# Patient Record
Sex: Female | Born: 1965 | Race: White | Hispanic: No | Marital: Married | Smoking: Former smoker
Health system: Southern US, Community
[De-identification: ages and names within clinical notes are randomized; demographics above are authoritative.]

## PROBLEM LIST (undated history)

## (undated) DIAGNOSIS — F329 Major depressive disorder, single episode, unspecified: Secondary | ICD-10-CM

## (undated) DIAGNOSIS — F32A Depression, unspecified: Secondary | ICD-10-CM

## (undated) DIAGNOSIS — T753XXA Motion sickness, initial encounter: Secondary | ICD-10-CM

## (undated) DIAGNOSIS — D649 Anemia, unspecified: Secondary | ICD-10-CM

## (undated) HISTORY — DX: Anemia, unspecified: D64.9

## (undated) HISTORY — PX: MOUTH SURGERY: SHX715

## (undated) HISTORY — DX: Depression, unspecified: F32.A

---
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
## 2011-06-02 ENCOUNTER — Ambulatory Visit: Payer: Self-pay

## 2012-08-01 IMAGING — CR DG CHEST 2V
1 series · 2 of 2 positions shown · non-contrast
Comparison: none

REASON FOR EXAM: unable to take deep breaths
COMMENTS:

PROCEDURE:     MDR - MDR CHEST PA(OR AP) AND LATERAL  - June 02, 2011  [DATE]
RESULT:     Lungs are clear, cardiac structures are unremarkable.

[Series 1: view not recorded · 0.17mm/px · 2 of 2 slices shown]
[im 1/2]
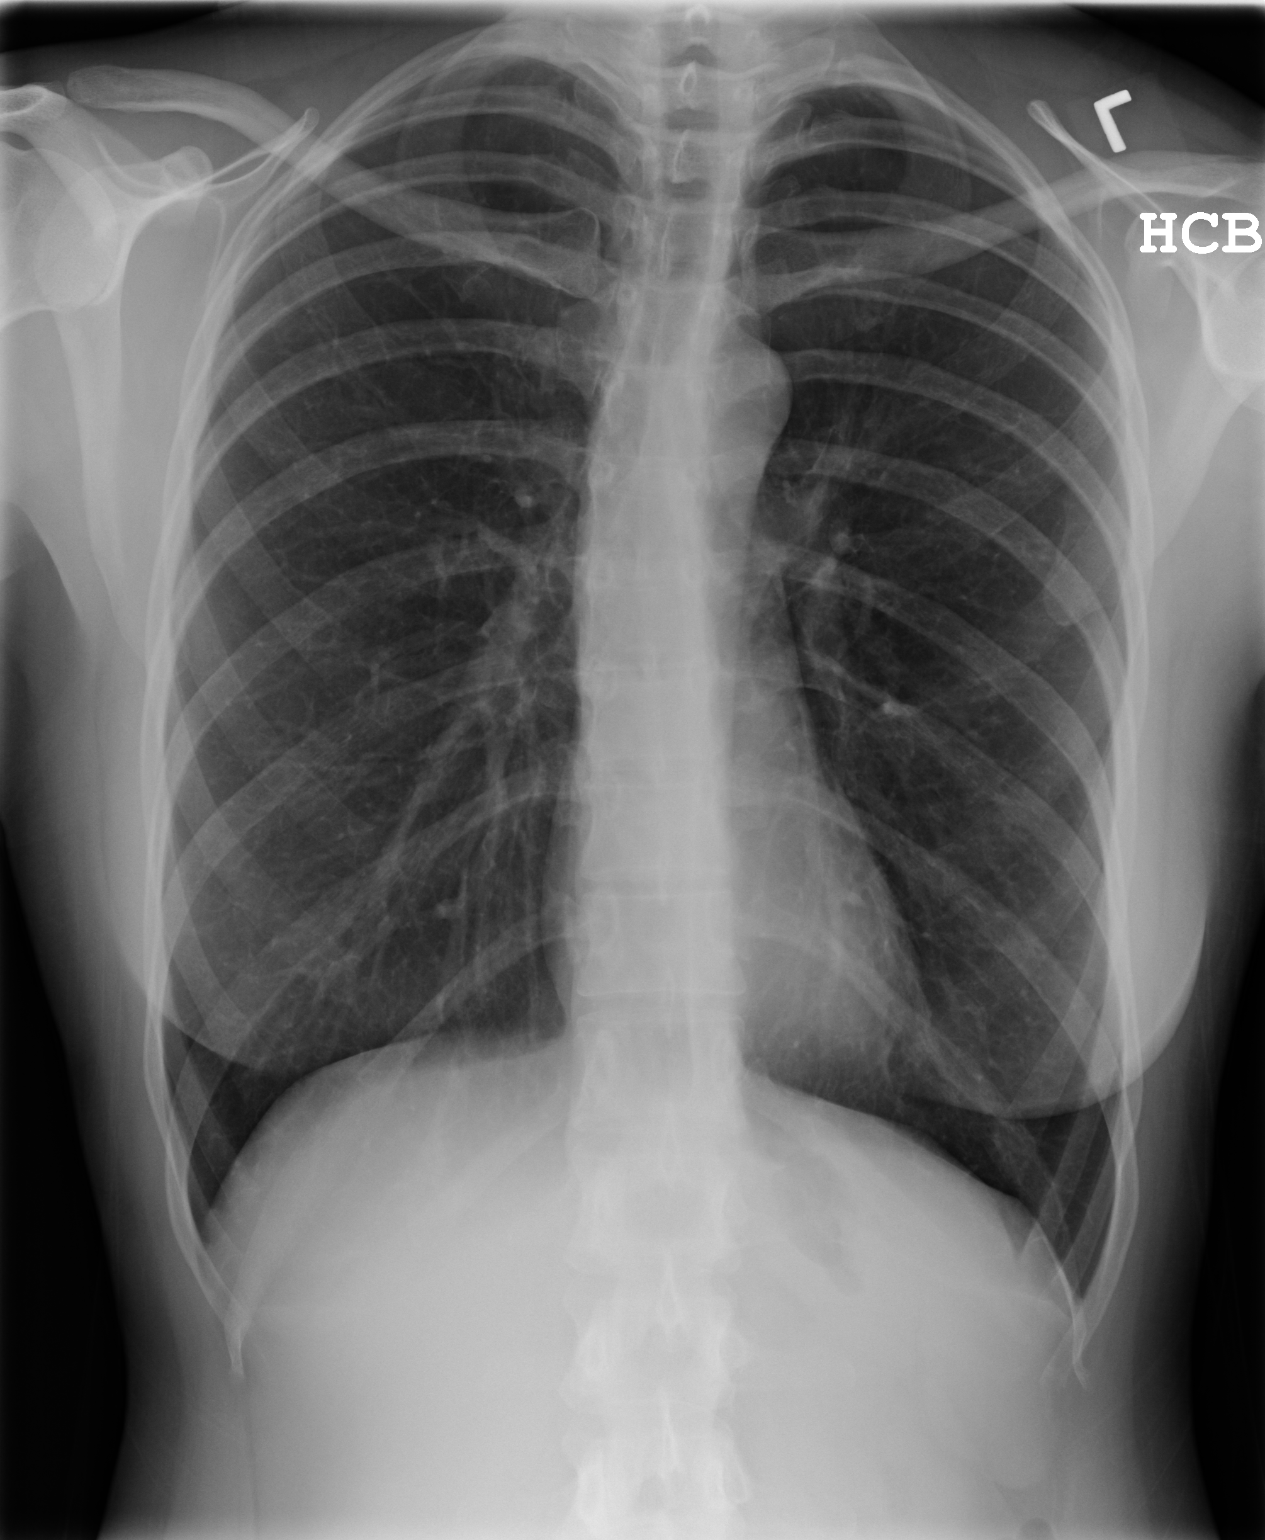
[im 2/2]
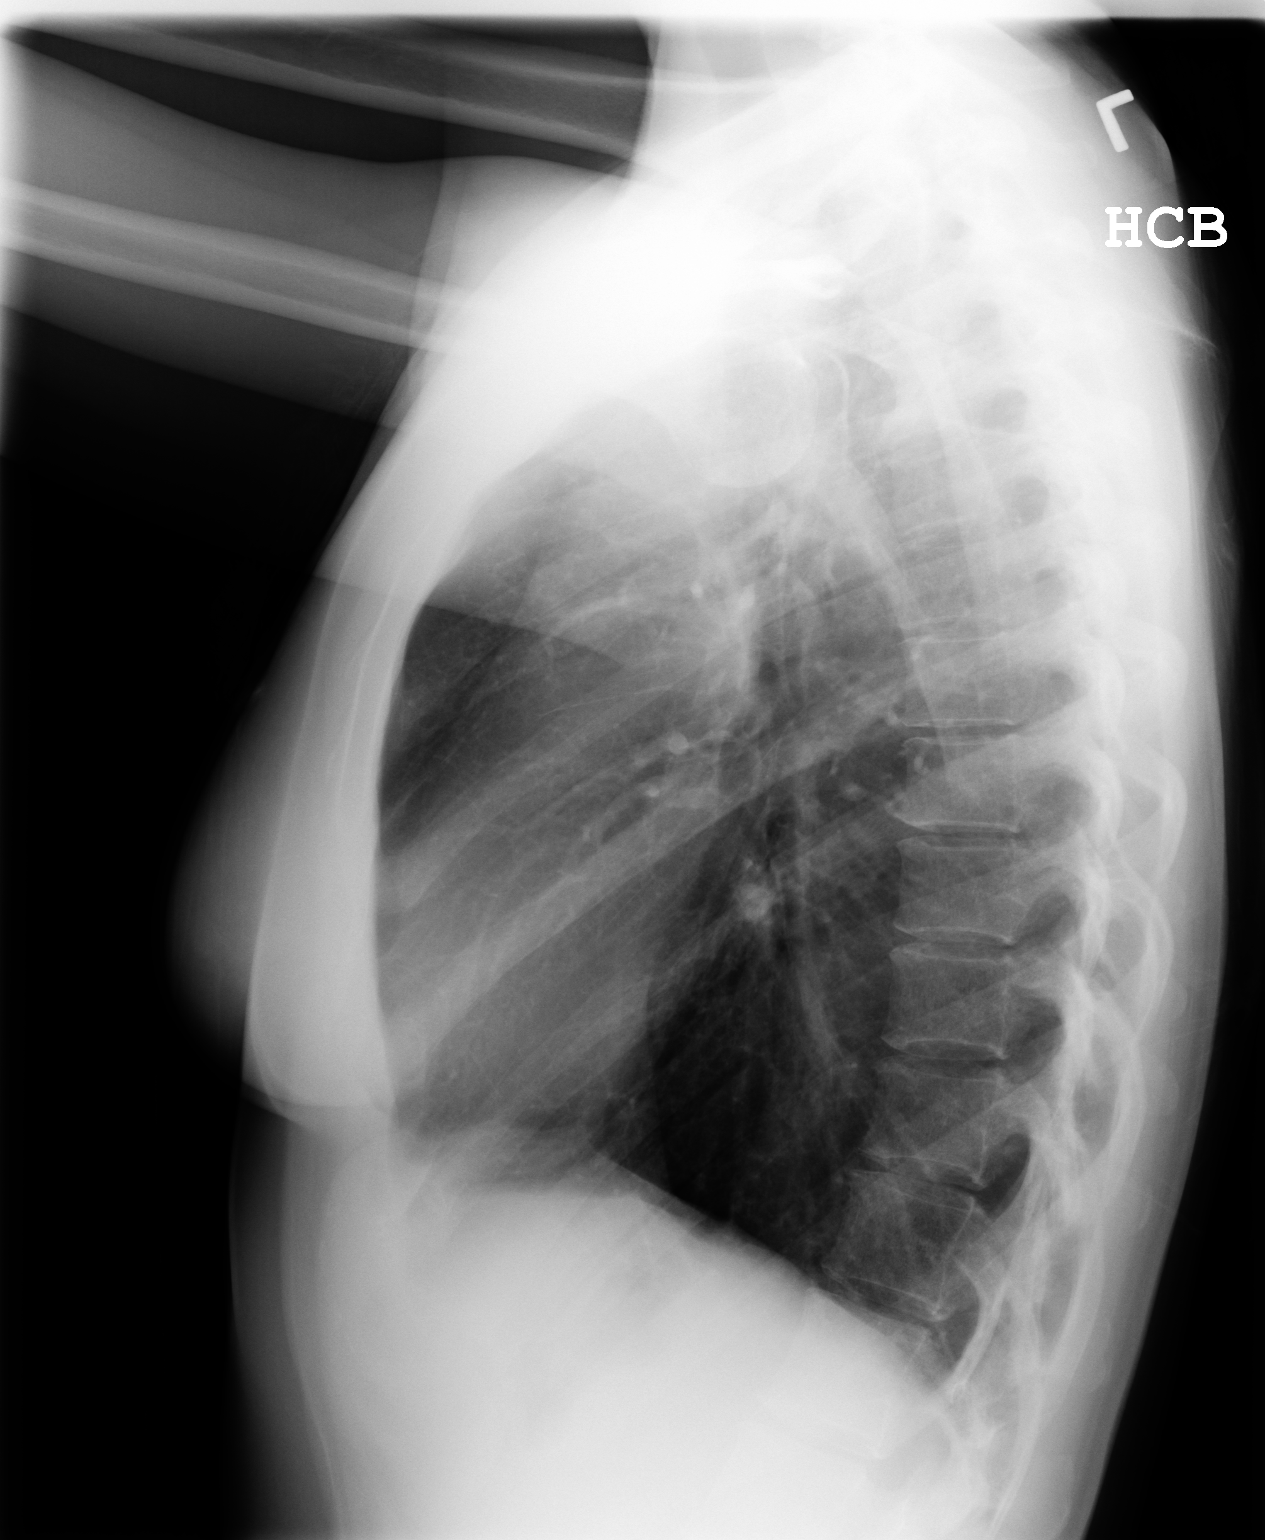

[2 of 2 positions shown; findings below may reference images not displayed]

IMPRESSION: No acute abnormality.

## 2020-03-12 ENCOUNTER — Other Ambulatory Visit (HOSPITAL_COMMUNITY)
Admission: RE | Admit: 2020-03-12 | Discharge: 2020-03-12 | Disposition: A | Discharge status: Home / Self Care | Payer: No Typology Code available for payment source | Source: Ambulatory Visit | Attending: Rehabilitation | Admitting: Rehabilitation

## 2020-03-12 ENCOUNTER — Encounter: Payer: Self-pay | Admitting: Family Medicine

## 2020-03-12 ENCOUNTER — Other Ambulatory Visit: Payer: Self-pay

## 2020-03-12 ENCOUNTER — Other Ambulatory Visit (HOSPITAL_COMMUNITY)
Admission: RE | Admit: 2020-03-12 | Discharge: 2020-03-12 | Disposition: A | Discharge status: Home / Self Care | Payer: No Typology Code available for payment source | Source: Ambulatory Visit | Attending: Family Medicine | Admitting: Family Medicine

## 2020-03-12 ENCOUNTER — Ambulatory Visit (INDEPENDENT_AMBULATORY_CARE_PROVIDER_SITE_OTHER): Payer: Self-pay | Admitting: Family Medicine

## 2020-03-12 VITALS — BP 137/89 | HR 69 | Temp 98.5°F | Ht 67.5 in | Wt 187.0 lb

## 2020-03-12 DIAGNOSIS — Z23 Encounter for immunization: Secondary | ICD-10-CM | POA: Diagnosis not present

## 2020-03-12 DIAGNOSIS — D509 Iron deficiency anemia, unspecified: Secondary | ICD-10-CM

## 2020-03-12 DIAGNOSIS — Z136 Encounter for screening for cardiovascular disorders: Secondary | ICD-10-CM | POA: Diagnosis not present

## 2020-03-12 DIAGNOSIS — Z1231 Encounter for screening mammogram for malignant neoplasm of breast: Secondary | ICD-10-CM

## 2020-03-12 DIAGNOSIS — Z124 Encounter for screening for malignant neoplasm of cervix: Secondary | ICD-10-CM

## 2020-03-12 DIAGNOSIS — Z0000 Encounter for general adult medical examination without abnormal findings: Secondary | ICD-10-CM

## 2020-03-12 DIAGNOSIS — L918 Other hypertrophic disorders of the skin: Secondary | ICD-10-CM | POA: Diagnosis not present

## 2020-03-12 DIAGNOSIS — Z7689 Persons encountering health services in other specified circumstances: Secondary | ICD-10-CM

## 2020-03-12 DIAGNOSIS — Z800 Family history of malignant neoplasm of digestive organs: Secondary | ICD-10-CM

## 2020-03-12 DIAGNOSIS — D489 Neoplasm of uncertain behavior, unspecified: Secondary | ICD-10-CM | POA: Diagnosis not present

## 2020-03-12 DIAGNOSIS — L821 Other seborrheic keratosis: Secondary | ICD-10-CM

## 2020-03-12 NOTE — Patient Instructions (Signed)
[facility] - [phone]  Call to schedule mammogram appointment

## 2020-03-12 NOTE — Assessment & Plan Note (Signed)
Recheck labs, continue iron supplementation.

## 2020-03-12 NOTE — Progress Notes (Signed)
BP 137/89   Pulse 69   Temp 98.5 F (36.9 C) (Oral)   Ht 5' 7.5" (1.715 m)   Wt 187 lb (84.8 kg)   SpO2 99%   BMI 28.86 kg/m    Subjective:    Patient ID: Annette Griffin, female    DOB: Nov 03, 1965, 54 y.o.   MRN: [id number]  HPI: Annette Griffin is a 54 y.o. female presenting on 03/12/2020 for comprehensive medical examination. Current medical complaints include:see below   Here today to establish care. Notes she has significant anxiety associated with doctor's offices so has not sought care in years. Also has not had insurance for quite some time.   Taking an iron supplement for iron def anemia, feeling well overall on this regimen. Was diagnosed in 2014 due to fatigue, numbness in extremities.   Father had colon cancer at age 46. Has never had colonoscopy but ready to get one.   Last CPE was many years ago. Last pap was 2014, benign. Never had mammogram.   Has a skin tag on left upper back she would like looked at. Has had several places removed in the past, no hx of skin cancer.      She currently lives with: Menopausal Symptoms: no  Depression Screen done today and results listed below:  Depression screen [facility] 2/9 03/12/2020  Decreased Interest 1  Down, Depressed, Hopeless 0  PHQ - 2 Score 1  Altered sleeping 3  Tired, decreased energy 3  Change in appetite 3  Feeling bad or failure about yourself  1  Trouble concentrating 0  Moving slowly or fidgety/restless 0  Suicidal thoughts 0  PHQ-9 Score 11    The patient does not have a history of falls. I did complete a risk assessment for falls. A plan of care for falls was documented.   Past Medical History:  Past Medical History:  Diagnosis Date  . Anemia   . Depression     Surgical History:  History reviewed. No pertinent surgical history.  Medications:  Current Outpatient Medications on File Prior to Visit  Medication Sig  . ferrous sulfate 325 (65 FE) MG EC tablet Take 325 mg by mouth daily.   No  current facility-administered medications on file prior to visit.    Allergies:  No Known Allergies  Social History:  Social History   Socioeconomic History  . Marital status: Married    Spouse name: Not on file  . Number of children: Not on file  . Years of education: Not on file  . Highest education level: Not on file  Occupational History  . Not on file  Tobacco Use  . Smoking status: Former Research scientist (life sciences)  . Smokeless tobacco: Never Used  Substance and Sexual Activity  . Alcohol use: Yes    Alcohol/week: 10.0 standard drinks    Types: 10 Glasses of wine per week  . Drug use: Never  . Sexual activity: Yes  Other Topics Concern  . Not on file  Social History Narrative  . Not on file   Social Determinants of Health   Financial Resource Strain:   . Difficulty of Paying Living Expenses:   Food Insecurity:   . Worried About Charity fundraiser in the Last Year:   . Arboriculturist in the Last Year:   Transportation Needs:   . Film/video editor (Medical):   Marland Kitchen Lack of Transportation (Non-Medical):   Physical Activity:   . Days of Exercise per Week:   .  Minutes of Exercise per Session:   Stress:   . Feeling of Stress :   Social Connections:   . Frequency of Communication with Friends and Family:   . Frequency of Social Gatherings with Friends and Family:   . Attends Religious Services:   . Active Member of Clubs or Organizations:   . Attends Archivist Meetings:   Marland Kitchen Marital Status:   Intimate Partner Violence:   . Fear of Current or Ex-Partner:   . Emotionally Abused:   Marland Kitchen Physically Abused:   . Sexually Abused:    Social History   Tobacco Use  Smoking Status Former Smoker  Smokeless Tobacco Never Used   Social History   Substance and Sexual Activity  Alcohol Use Yes  . Alcohol/week: 10.0 standard drinks  . Types: 10 Glasses of wine per week    Family History:  Family History  Problem Relation Age of Onset  . Colon cancer Father     Past  medical history, surgical history, medications, allergies, family history and social history reviewed with patient today and changes made to appropriate areas of the chart.   Review of Systems - General ROS: negative Psychological ROS: negative Ophthalmic ROS: negative ENT ROS: negative Allergy and Immunology ROS: negative Hematological and Lymphatic ROS: negative Endocrine ROS: negative Breast ROS: negative for breast lumps Respiratory ROS: no cough, shortness of breath, or wheezing Cardiovascular ROS: no chest pain or dyspnea on exertion Gastrointestinal ROS: no abdominal pain, change in bowel habits, or black or bloody stools Genito-Urinary ROS: no dysuria, trouble voiding, or hematuria Musculoskeletal ROS: negative Neurological ROS: no TIA or stroke symptoms Dermatological ROS: negative All other ROS negative except what is listed above and in the HPI.      Objective:    BP 137/89   Pulse 69   Temp 98.5 F (36.9 C) (Oral)   Ht 5' 7.5" (1.715 m)   Wt 187 lb (84.8 kg)   SpO2 99%   BMI 28.86 kg/m   Wt Readings from Last 3 Encounters:  03/12/20 187 lb (84.8 kg)    Physical Exam Vitals and nursing note reviewed.  Constitutional:      General: She is not in acute distress.    Appearance: She is well-developed.  HENT:     Head: Atraumatic.     Right Ear: External ear normal.     Left Ear: External ear normal.     Nose: Nose normal.     Mouth/Throat:     Pharynx: No oropharyngeal exudate.  Eyes:     General: No scleral icterus.    Conjunctiva/sclera: Conjunctivae normal.     Pupils: Pupils are equal, round, and reactive to light.  Neck:     Thyroid: No thyromegaly.  Cardiovascular:     Rate and Rhythm: Normal rate and regular rhythm.     Heart sounds: Normal heart sounds.  Pulmonary:     Effort: Pulmonary effort is normal. No respiratory distress.     Breath sounds: Normal breath sounds.  Chest:     Breasts:        Right: No mass, skin change or tenderness.         Left: No mass, skin change or tenderness.  Abdominal:     General: Bowel sounds are normal.     Palpations: Abdomen is soft. There is no mass.     Tenderness: There is no abdominal tenderness.  Genitourinary:    General: Normal vulva.     Vagina: No  vaginal discharge.  Musculoskeletal:        General: No tenderness. Normal range of motion.     Cervical back: Normal range of motion and neck supple.  Lymphadenopathy:     Cervical: No cervical adenopathy.     Upper Body:     Right upper body: No axillary adenopathy.     Left upper body: No axillary adenopathy.  Skin:    General: Skin is warm and dry.     Findings: No rash.     Comments: 3 pinpoint skin tags present left axilla Seborrheic keratosis present left low back  Neurological:     Mental Status: She is alert and oriented to person, place, and time.     Cranial Nerves: No cranial nerve deficit.  Psychiatric:        Behavior: Behavior normal.    Procedure: Cryotherapy inflamed skin tags left axilla Procedure explained in detail and pt questions answered. Cryotherapy wand used to treat bases of 3 skin tags left axilla. Procedure tolerated well without immediate complications noted. Home care and expectations given, may need re-treatment in a few weeks.   No results found for this or any previous visit.    Assessment & Plan:   Problem List Items Addressed This Visit      Other   Iron deficiency anemia    Recheck labs, continue iron supplementation.       Relevant Medications   ferrous sulfate 325 (65 FE) MG EC tablet   Other Relevant Orders   CBC with Differential/Platelet   Ferritin    Other Visit Diagnoses    Inflamed skin tag    -  Primary   Cryotherapy used today, home care and return precautions reviewed.    Encounter to establish care       Neoplasm of uncertain behavior       Requesting referral to Derm for skin checks, hx of atypical moles s/p excision   Relevant Orders   Ambulatory referral to  Dermatology   Seborrheic keratosis       Reassurance given   Annual physical exam       Relevant Orders   CBC with Differential/Platelet   Comprehensive metabolic panel   TSH   UA/M w/rflx Culture, Routine   Screening for cardiovascular condition       Relevant Orders   Lipid Panel w/o Chol/HDL Ratio   Need for diphtheria-tetanus-pertussis (Tdap) vaccine       Relevant Orders   Tdap vaccine greater than or equal to 7yo IM (Completed)   Screening for cervical cancer       Relevant Orders   Cytology - PAP   Encounter for screening mammogram for malignant neoplasm of breast       Relevant Orders   MM DIGITAL SCREENING BILATERAL   Family history of colon cancer       Relevant Orders   Ambulatory referral to Gastroenterology       Follow up plan: Return in about 1 year (around 03/12/2021) for CPE.   LABORATORY TESTING:  - Pap smear: pap done  IMMUNIZATIONS:   - Tdap: Tetanus vaccination status reviewed: Td vaccination indicated and given today. - Influenza: Postponed to flu season  SCREENING: -Mammogram: Ordered today  - Colonoscopy: Ordered today   PATIENT COUNSELING:   Advised to take 1 mg of folate supplement per day if capable of pregnancy.   Sexuality: Discussed sexually transmitted diseases, partner selection, use of condoms, avoidance of unintended pregnancy  and contraceptive alternatives.  Advised to avoid cigarette smoking.  I discussed with the patient that most people either abstain from alcohol or drink within safe limits (<=14/week and <=4 drinks/occasion for males, <=7/weeks and <= 3 drinks/occasion for females) and that the risk for alcohol disorders and other health effects rises proportionally with the number of drinks per week and how often a drinker exceeds daily limits.  Discussed cessation/primary prevention of drug use and availability of treatment for abuse.   Diet: Encouraged to adjust caloric intake to maintain  or achieve ideal body weight,  to reduce intake of dietary saturated fat and total fat, to limit sodium intake by avoiding high sodium foods and not adding table salt, and to maintain adequate dietary potassium and calcium preferably from fresh fruits, vegetables, and low-fat dairy products.    stressed the importance of regular exercise  Injury prevention: Discussed safety belts, safety helmets, smoke detector, smoking near bedding or upholstery.   Dental health: Discussed importance of regular tooth brushing, flossing, and dental visits.    NEXT PREVENTATIVE PHYSICAL DUE IN 1 YEAR. Return in about 1 year (around 03/12/2021) for CPE.

## 2020-03-12 NOTE — Progress Notes (Deleted)
   BP 137/89   Pulse 69   Temp 98.5 F (36.9 C) (Oral)   Ht 5' 7.5" (1.715 m)   Wt 187 lb (84.8 kg)   SpO2 99%   BMI 28.86 kg/m    Subjective:    Patient ID: Annette Griffin, female    DOB: [birth year], 54 y.o.   MRN: [id number]  HPI: Annette Griffin is a 54 y.o. female  Chief Complaint  Patient presents with  . Establish Care    pt has concerns about has had molds on her body. requested an order for a colonoscopy   Here today to establish care. Notes she has significant anxiety associated with doctor's offices so has not sought care in years. Also has not had insurance for quite some time.   Taking an iron supplement for iron def anemia, feeling well overall on this regimen. Was diagnosed in 2014 due to fatigue, numbness in extremities.   Father had colon cancer at age 17. Has never had colonoscopy but ready to get one.   Last CPE was many years ago. Last pap was 2014, benign. Never had mammogram.   Has a skin tag on left upper back she would like looked at. Has had several places removed in the past, no hx of skin cancer.   Relevant past medical, surgical, family and social history reviewed and updated as indicated. Interim medical history since our last visit reviewed. Allergies and medications reviewed and updated.  Review of Systems  Per HPI unless specifically indicated above     Objective:    BP 137/89   Pulse 69   Temp 98.5 F (36.9 C) (Oral)   Ht 5' 7.5" (1.715 m)   Wt 187 lb (84.8 kg)   SpO2 99%   BMI 28.86 kg/m   Wt Readings from Last 3 Encounters:  03/12/20 187 lb (84.8 kg)    Physical Exam  No results found for this or any previous visit.    Assessment & Plan:   Problem List Items Addressed This Visit    None       Follow up plan: No follow-ups on file.

## 2020-03-13 LAB — COMPREHENSIVE METABOLIC PANEL
ALT: 16 IU/L (ref 0–32)
AST: 18 IU/L (ref 0–40)
Albumin/Globulin Ratio: 1.9 (ref 1.2–2.2)
Albumin: 4.6 g/dL (ref 3.8–4.9)
Alkaline Phosphatase: 51 IU/L (ref 48–121)
BUN/Creatinine Ratio: 15 (ref 9–23)
BUN: 9 mg/dL (ref 6–24)
Bilirubin Total: 0.4 mg/dL (ref 0.0–1.2)
CO2: 22 mmol/L (ref 20–29)
Calcium: 9.7 mg/dL (ref 8.7–10.2)
Chloride: 101 mmol/L (ref 96–106)
Creatinine, Ser: 0.62 mg/dL (ref 0.57–1.00)
GFR calc Af Amer: 120 mL/min/{1.73_m2} (ref >59)
GFR calc non Af Amer: 104 mL/min/{1.73_m2} (ref >59)
Globulin, Total: 2.4 g/dL (ref 1.5–4.5)
Glucose: 87 mg/dL (ref 65–99)
Potassium: 4 mmol/L (ref 3.5–5.2)
Sodium: 141 mmol/L (ref 134–144)
Total Protein: 7 g/dL (ref 6.0–8.5)

## 2020-03-13 LAB — CBC WITH DIFFERENTIAL/PLATELET
Basophils Absolute: 0 10*3/uL (ref 0.0–0.2)
Basos: 1 %
EOS (ABSOLUTE): 0.1 10*3/uL (ref 0.0–0.4)
Eos: 2 %
Hematocrit: 41.8 % (ref 34.0–46.6)
Hemoglobin: 13.8 g/dL (ref 11.1–15.9)
Immature Grans (Abs): 0 10*3/uL (ref 0.0–0.1)
Immature Granulocytes: 0 %
Lymphocytes Absolute: 1.4 10*3/uL (ref 0.7–3.1)
Lymphs: 27 %
MCH: 32.5 pg (ref 26.6–33.0)
MCHC: 33 g/dL (ref 31.5–35.7)
MCV: 98 fL — ABNORMAL HIGH (ref 79–97)
Monocytes Absolute: 0.4 10*3/uL (ref 0.1–0.9)
Monocytes: 8 %
Neutrophils Absolute: 3.2 10*3/uL (ref 1.4–7.0)
Neutrophils: 62 %
Platelets: 256 10*3/uL (ref 150–450)
RBC: 4.25 x10E6/uL (ref 3.77–5.28)
RDW: 12.8 % (ref 11.7–15.4)
WBC: 5.1 10*3/uL (ref 3.4–10.8)

## 2020-03-13 LAB — TSH: TSH: 2.04 u[IU]/mL (ref 0.450–4.500)

## 2020-03-13 LAB — LIPID PANEL W/O CHOL/HDL RATIO
Cholesterol, Total: 194 mg/dL (ref 100–199)
HDL: 69 mg/dL (ref >39)
LDL Chol Calc (NIH): 102 mg/dL — ABNORMAL HIGH (ref 0–99)
Triglycerides: 130 mg/dL (ref 0–149)
VLDL Cholesterol Cal: 23 mg/dL (ref 5–40)

## 2020-03-13 LAB — FERRITIN: Ferritin: 13 ng/mL — ABNORMAL LOW (ref 15–150)

## 2020-03-14 LAB — UA/M W/RFLX CULTURE, ROUTINE
Bilirubin, UA: NEGATIVE
Glucose, UA: NEGATIVE
Ketones, UA: NEGATIVE
Leukocytes,UA: NEGATIVE
Nitrite, UA: NEGATIVE
Protein,UA: NEGATIVE
Specific Gravity, UA: 1.01 (ref 1.005–1.030)
Urobilinogen, Ur: 0.2 mg/dL (ref 0.2–1.0)
pH, UA: 6.5 (ref 5.0–7.5)

## 2020-03-14 LAB — MICROSCOPIC EXAMINATION

## 2020-03-14 LAB — URINE CULTURE, REFLEX

## 2020-03-18 LAB — CYTOLOGY - PAP
Comment: NEGATIVE
Diagnosis: NEGATIVE
High risk HPV: NEGATIVE

## 2020-03-22 ENCOUNTER — Telehealth: Payer: Self-pay

## 2020-03-22 ENCOUNTER — Telehealth (INDEPENDENT_AMBULATORY_CARE_PROVIDER_SITE_OTHER): Payer: Self-pay | Admitting: Gastroenterology

## 2020-03-22 DIAGNOSIS — Z1211 Encounter for screening for malignant neoplasm of colon: Secondary | ICD-10-CM

## 2020-03-22 NOTE — Telephone Encounter (Signed)
Gastroenterology Pre-Procedure Review  Request Date: 04/08/20 Requesting Physician: Dr. Allen Norris  PATIENT REVIEW QUESTIONS: The patient responded to the following health history questions as indicated:    1. Are you having any GI issues? no 2. Do you have a personal history of Polyps? no 3. Do you have a family history of Colon Cancer or Polyps? yes (father colon cancer, brother colon polyps) 4. Diabetes Mellitus? no 5. Joint replacements in the past 12 months?no 6. Major health problems in the past 3 months?no 7. Any artificial heart valves, MVP, or defibrillator?no    MEDICATIONS & ALLERGIES:    Patient reports the following regarding taking any anticoagulation/antiplatelet therapy:   Plavix, Coumadin, Eliquis, Xarelto, Lovenox, Pradaxa, Brilinta, or Effient? no Aspirin? no  Patient confirms/reports the following medications:  Current Outpatient Medications  Medication Sig Dispense Refill  . ferrous sulfate 325 (65 FE) MG EC tablet Take 325 mg by mouth daily.     No current facility-administered medications for this visit.    Patient confirms/reports the following allergies:  No Known Allergies  No orders of the defined types were placed in this encounter.   AUTHORIZATION INFORMATION Primary Insurance: 1D#: Group #:  Secondary Insurance: 1D#: Group #:  SCHEDULE INFORMATION: Date: 04/12/20 Time: Location:[facility]

## 2020-03-22 NOTE — Progress Notes (Signed)
Gastroenterology Pre-Procedure Review  Request Date: Monday 04/12/20 Requesting Physician: Dr. Allen Norris  PATIENT REVIEW QUESTIONS: The patient responded to the following health history questions as indicated:    1. Are you having any GI issues? no 2. Do you have a personal history of Polyps? no 3. Do you have a family history of Colon Cancer or Polyps? no 4. Diabetes Mellitus? no 5. Joint replacements in the past 12 months?no 6. Major health problems in the past 3 months?no 7. Any artificial heart valves, MVP, or defibrillator?no    MEDICATIONS & ALLERGIES:    Patient reports the following regarding taking any anticoagulation/antiplatelet therapy:   Plavix, Coumadin, Eliquis, Xarelto, Lovenox, Pradaxa, Brilinta, or Effient? no Aspirin? no  Patient confirms/reports the following medications:  Current Outpatient Medications  Medication Sig Dispense Refill  . ferrous sulfate 325 (65 FE) MG EC tablet Take 325 mg by mouth daily.     No current facility-administered medications for this visit.    Patient confirms/reports the following allergies:  No Known Allergies  No orders of the defined types were placed in this encounter.   AUTHORIZATION INFORMATION Primary Insurance: 1D#: Group #:  Secondary Insurance: 1D#: Group #:  SCHEDULE INFORMATION: Date: Monday 04/12/20 Time: Location:[facility]

## 2020-04-05 ENCOUNTER — Encounter: Payer: Self-pay | Admitting: Gastroenterology

## 2020-04-05 ENCOUNTER — Ambulatory Visit
Admission: RE | Admit: 2020-04-05 | Discharge: 2020-04-05 | Disposition: A | Discharge status: Home / Self Care | Payer: No Typology Code available for payment source | Source: Ambulatory Visit | Attending: Family Medicine | Admitting: Family Medicine

## 2020-04-05 ENCOUNTER — Other Ambulatory Visit: Payer: Self-pay

## 2020-04-05 DIAGNOSIS — Z1231 Encounter for screening mammogram for malignant neoplasm of breast: Secondary | ICD-10-CM | POA: Insufficient documentation

## 2020-04-08 ENCOUNTER — Other Ambulatory Visit: Payer: Self-pay

## 2020-04-08 ENCOUNTER — Other Ambulatory Visit
Admission: RE | Admit: 2020-04-08 | Discharge: 2020-04-08 | Disposition: A | Discharge status: Home / Self Care | Payer: No Typology Code available for payment source | Source: Ambulatory Visit | Attending: Gastroenterology | Admitting: Gastroenterology

## 2020-04-08 DIAGNOSIS — Z20822 Contact with and (suspected) exposure to covid-19: Secondary | ICD-10-CM | POA: Diagnosis not present

## 2020-04-08 DIAGNOSIS — Z01812 Encounter for preprocedural laboratory examination: Secondary | ICD-10-CM | POA: Insufficient documentation

## 2020-04-08 LAB — SARS CORONAVIRUS 2 (TAT 6-24 HRS): SARS Coronavirus 2: NEGATIVE

## 2020-04-08 NOTE — Discharge Instructions (Signed)
General Anesthesia, Adult, Care After This sheet gives you information about how to care for yourself after your procedure. Your health care provider may also give you more specific instructions. If you have problems or questions, contact your health care provider. What can I expect after the procedure? After the procedure, the following side effects are common:  Pain or discomfort at the IV site.  Nausea.  Vomiting.  Sore throat.  Trouble concentrating.  Feeling cold or chills.  Weak or tired.  Sleepiness and fatigue.  Soreness and body aches. These side effects can affect parts of the body that were not involved in surgery. Follow these instructions at home:  For at least 24 hours after the procedure:  Have a responsible adult stay with you. It is important to have someone help care for you until you are awake and alert.  Rest as needed.  Do not: ? Participate in activities in which you could fall or become injured. ? Drive. ? Use heavy machinery. ? Drink alcohol. ? Take sleeping pills or medicines that cause drowsiness. ? Make important decisions or sign legal documents. ? Take care of children on your own. Eating and drinking  Follow any instructions from your health care provider about eating or drinking restrictions.  When you feel hungry, start by eating small amounts of foods that are soft and easy to digest (bland), such as toast. Gradually return to your regular diet.  Drink enough fluid to keep your urine pale yellow.  If you vomit, rehydrate by drinking water, juice, or clear broth. General instructions  If you have sleep apnea, surgery and certain medicines can increase your risk for breathing problems. Follow instructions from your health care provider about wearing your sleep device: ? Anytime you are sleeping, including during daytime naps. ? While taking prescription pain medicines, sleeping medicines, or medicines that make you drowsy.  Return to  your normal activities as told by your health care provider. Ask your health care provider what activities are safe for you.  Take over-the-counter and prescription medicines only as told by your health care provider.  If you smoke, do not smoke without supervision.  Keep all follow-up visits as told by your health care provider. This is important. Contact a health care provider if:  You have nausea or vomiting that does not get better with medicine.  You cannot eat or drink without vomiting.  You have pain that does not get better with medicine.  You are unable to pass urine.  You develop a skin rash.  You have a fever.  You have redness around your IV site that gets worse. Get help right away if:  You have difficulty breathing.  You have chest pain.  You have blood in your urine or stool, or you vomit blood. Summary  After the procedure, it is common to have a sore throat or nausea. It is also common to feel tired.  Have a responsible adult stay with you for the first 24 hours after general anesthesia. It is important to have someone help care for you until you are awake and alert.  When you feel hungry, start by eating small amounts of foods that are soft and easy to digest (bland), such as toast. Gradually return to your regular diet.  Drink enough fluid to keep your urine pale yellow.  Return to your normal activities as told by your health care provider. Ask your health care provider what activities are safe for you. This information is not   intended to replace advice given to you by your health care provider. Make sure you discuss any questions you have with your health care provider. Document Revised: 10/05/2017 Document Reviewed: 05/18/2017 Elsevier Patient Education  2020 Elsevier Inc[employer]  

## 2020-04-12 ENCOUNTER — Ambulatory Visit: Payer: No Typology Code available for payment source | Admitting: Anesthesiology

## 2020-04-12 ENCOUNTER — Ambulatory Visit
Admission: RE | Admit: 2020-04-12 | Discharge: 2020-04-12 | Disposition: A | Discharge status: Home / Self Care | Payer: No Typology Code available for payment source | Attending: Gastroenterology | Admitting: Gastroenterology

## 2020-04-12 ENCOUNTER — Other Ambulatory Visit: Payer: Self-pay

## 2020-04-12 ENCOUNTER — Encounter
Admission: RE | Disposition: A | Discharge status: Home / Self Care | Payer: Self-pay | Source: Home / Self Care | Attending: Gastroenterology

## 2020-04-12 ENCOUNTER — Encounter: Payer: Self-pay | Admitting: Gastroenterology

## 2020-04-12 DIAGNOSIS — D649 Anemia, unspecified: Secondary | ICD-10-CM | POA: Diagnosis not present

## 2020-04-12 DIAGNOSIS — K640 First degree hemorrhoids: Secondary | ICD-10-CM | POA: Diagnosis not present

## 2020-04-12 DIAGNOSIS — Z79899 Other long term (current) drug therapy: Secondary | ICD-10-CM | POA: Insufficient documentation

## 2020-04-12 DIAGNOSIS — Z87891 Personal history of nicotine dependence: Secondary | ICD-10-CM | POA: Insufficient documentation

## 2020-04-12 DIAGNOSIS — Z1211 Encounter for screening for malignant neoplasm of colon: Secondary | ICD-10-CM | POA: Diagnosis present

## 2020-04-12 DIAGNOSIS — K5730 Diverticulosis of large intestine without perforation or abscess without bleeding: Secondary | ICD-10-CM | POA: Insufficient documentation

## 2020-04-12 HISTORY — PX: COLONOSCOPY WITH PROPOFOL: SHX5780

## 2020-04-12 HISTORY — DX: Motion sickness, initial encounter: T75.3XXA

## 2020-04-12 LAB — POCT PREGNANCY, URINE: Preg Test, Ur: NEGATIVE

## 2020-04-12 SURGERY — COLONOSCOPY WITH PROPOFOL
Anesthesia: General | Site: Rectum

## 2020-04-12 MED ORDER — LIDOCAINE HCL (CARDIAC) PF 100 MG/5ML IV SOSY
PREFILLED_SYRINGE | INTRAVENOUS | Status: DC | PRN
  Administered 2020-04-12: 50 mg via INTRAVENOUS

## 2020-04-12 MED ORDER — LACTATED RINGERS IV SOLN: INTRAVENOUS | Status: DC

## 2020-04-12 MED ORDER — PROPOFOL 10 MG/ML IV BOLUS
INTRAVENOUS | Status: DC | PRN
  Administered 2020-04-12 (×2): 100 mg via INTRAVENOUS

## 2020-04-12 MED ORDER — STERILE WATER FOR IRRIGATION IR SOLN
Status: DC | PRN
  Administered 2020-04-12: 50 mL

## 2020-04-12 SURGICAL SUPPLY — 5 items
GOWN CVR UNV OPN BCK APRN NK (MISCELLANEOUS) ×2 IMPLANT
GOWN ISOL THUMB LOOP REG UNIV (MISCELLANEOUS) ×4
KIT ENDO PROCEDURE OLY (KITS) ×3 IMPLANT
MANIFOLD NEPTUNE II (INSTRUMENTS) ×3 IMPLANT
WATER STERILE IRR 250ML POUR (IV SOLUTION) ×3 IMPLANT

## 2020-04-12 NOTE — H&P (Signed)
Lucilla Lame, MD [address] [place], [place] [address] Phone: [phone] Fax : [phone]  Primary Care Physician:  [name], PA-C Primary Gastroenterologist:  Dr. Allen Norris  Pre-Procedure History & Physical: HPI:  JACQUELINE SPOFFORD is a 54 y.o. female is here for a screening colonoscopy.   Past Medical History:  Diagnosis Date  . Anemia   . Depression   . Motion sickness    boats    Past Surgical History:  Procedure Laterality Date  . MOUTH SURGERY      Prior to Admission medications   Medication Sig Start Date End Date Taking? Authorizing Provider  ferrous sulfate 325 (65 FE) MG EC tablet Take 325 mg by mouth daily.   Yes [provider]  Multiple Vitamin (MULTIVITAMIN) tablet Take 1 tablet by mouth daily.   Yes [provider]    Allergies as of 03/22/2020  . (No Known Allergies)    Family History  Problem Relation Age of Onset  . Colon cancer Father   . Breast cancer Maternal Grandmother     Social History   Socioeconomic History  . Marital status: Married    Spouse name: Not on file  . Number of children: Not on file  . Years of education: Not on file  . Highest education level: Not on file  Occupational History  . Not on file  Tobacco Use  . Smoking status: Former Smoker    Quit date: 2000    Years since quitting: 21.5  . Smokeless tobacco: Never Used  Vaping Use  . Vaping Use: Never used  Substance and Sexual Activity  . Alcohol use: Yes    Alcohol/week: 10.0 standard drinks    Types: 10 Glasses of wine per week  . Drug use: Never  . Sexual activity: Yes  Other Topics Concern  . Not on file  Social History Narrative  . Not on file   Social Determinants of Health   Financial Resource Strain:   . Difficulty of Paying Living Expenses:   Food Insecurity:   . Worried About Charity fundraiser in the Last Year:   . Arboriculturist in the Last Year:   Transportation Needs:   . Lexicographer (Medical):   Marland Kitchen Lack of Transportation (Non-Medical):   Physical Activity:   . Days of Exercise per Week:   . Minutes of Exercise per Session:   Stress:   . Feeling of Stress :   Social Connections:   . Frequency of Communication with Friends and Family:   . Frequency of Social Gatherings with Friends and Family:   . Attends Religious Services:   . Active Member of Clubs or Organizations:   . Attends Archivist Meetings:   Marland Kitchen Marital Status:   Intimate Partner Violence:   . Fear of Current or Ex-Partner:   . Emotionally Abused:   Marland Kitchen Physically Abused:   . Sexually Abused:     Review of Systems: See HPI, otherwise negative ROS  Physical Exam: BP (!) 135/96   Pulse 98   Temp 97.7 F (36.5 C) (Temporal)   Resp 16   Ht 5\' 8"  (1.727 m)   Wt 81.6 kg   LMP 03/17/2020 (Approximate) Comment: UPREG neg  SpO2 98%   BMI 27.37 kg/m  General:   Alert,  pleasant and cooperative in NAD Head:  Normocephalic and atraumatic. Neck:  Supple; no masses or thyromegaly. Lungs:  Clear throughout to auscultation.  Heart:  Regular rate and rhythm. Abdomen:  Soft, nontender and nondistended. Normal bowel sounds, without guarding, and without rebound.   Neurologic:  Alert and  oriented x4;  grossly normal neurologically.  Impression/Plan: [name] is now here to undergo a screening colonoscopy.  Risks, benefits, and alternatives regarding colonoscopy have been reviewed with the patient.  Questions have been answered.  All parties agreeable.

## 2020-04-12 NOTE — Transfer of Care (Signed)
Immediate Anesthesia Transfer of Care Note  Patient: Annette Griffin  Procedure(s) Performed: COLONOSCOPY WITH PROPOFOL (N/A Rectum)  Patient Location: PACU  Anesthesia Type: General  Level of Consciousness: awake, alert  and patient cooperative  Airway and Oxygen Therapy: Patient Spontanous Breathing and Patient connected to supplemental oxygen  Post-op Assessment: Post-op Vital signs reviewed, Patient's Cardiovascular Status Stable, Respiratory Function Stable, Patent Airway and No signs of Nausea or vomiting  Post-op Vital Signs: Reviewed and stable  Complications: No complications documented.

## 2020-04-12 NOTE — Anesthesia Postprocedure Evaluation (Signed)
Anesthesia Post Note  Patient: Annette Griffin  Procedure(s) Performed: COLONOSCOPY WITH PROPOFOL (N/A Rectum)     Patient location during evaluation: PACU Anesthesia Type: General Level of consciousness: awake and alert Pain management: pain level controlled Vital Signs Assessment: post-procedure vital signs reviewed and stable Respiratory status: spontaneous breathing, nonlabored ventilation, respiratory function stable and patient connected to nasal cannula oxygen Cardiovascular status: blood pressure returned to baseline and stable Postop Assessment: no apparent nausea or vomiting Anesthetic complications: no   No complications documented.  Adele Barthel Adrienna Karis

## 2020-04-12 NOTE — Anesthesia Procedure Notes (Signed)
Procedure Name: General with mask airway Date/Time: 04/12/2020 10:05 AM Performed by: Jeannene Patella, CRNA Pre-anesthesia Checklist: Timeout performed, Patient being monitored, Suction available, Emergency Drugs available and Patient identified Patient Re-evaluated:Patient Re-evaluated prior to induction Oxygen Delivery Method: Nasal cannula

## 2020-04-12 NOTE — Op Note (Signed)
[facility] Patient Name: Annette Griffin Procedure Date: 04/12/2020 9:53 AM MRN: [id number] Account #: [id number] Date of Birth: 1966/06/07 Admit Type: Outpatient Age: 54 Room: [facility] OR ROOM 01 Gender: Female Note Status: Finalized Procedure:             Colonoscopy Indications:           Screening for colorectal malignant neoplasm Providers:             Lucilla Lame MD, MD Referring MD:          Avon Gully (Referring MD) Medicines:             Propofol per Anesthesia Complications:         No immediate complications. Procedure:             Pre-Anesthesia Assessment:                        - Prior to the procedure, a History and Physical was                         performed, and patient medications and allergies were                         reviewed. The patient's tolerance of previous                         anesthesia was also reviewed. The risks and benefits                         of the procedure and the sedation options and risks                         were discussed with the patient. All questions were                         answered, and informed consent was obtained. Prior                         Anticoagulants: The patient has taken no previous                         anticoagulant or antiplatelet agents. ASA Grade                         Assessment: II - A patient with mild systemic disease.                         After reviewing the risks and benefits, the patient                         was deemed in satisfactory condition to undergo the                         procedure.                        After obtaining informed consent, the colonoscope was  passed under direct vision. Throughout the procedure,                         the patient's blood pressure, pulse, and oxygen                         saturations were monitored continuously. The                         Colonoscope was introduced through the anus  and                         advanced to the the cecum, identified by appendiceal                         orifice and ileocecal valve. The colonoscopy was                         performed without difficulty. The patient tolerated                         the procedure well. The quality of the bowel                         preparation was excellent. Findings:      The perianal and digital rectal examinations were normal.      Non-bleeding internal hemorrhoids were found during retroflexion. The       hemorrhoids were Grade I (internal hemorrhoids that do not prolapse).      Multiple small-mouthed diverticula were found in the sigmoid colon. Impression:            - Non-bleeding internal hemorrhoids.                        - Diverticulosis in the sigmoid colon.                        - No specimens collected. Recommendation:        - Discharge patient to home.                        - Resume previous diet.                        - Continue present medications.                        - Repeat colonoscopy in 5 years for surveillance. Procedure Code(s):     --- Professional ---                        [id number], Colonoscopy, flexible; diagnostic, including                         collection of specimen(s) by brushing or washing, when                         performed (separate procedure) Diagnosis Code(s):     --- Professional ---  Z12.11, Encounter for screening for malignant neoplasm                         of colon CPT copyright 2019 American Medical Association. All rights reserved. The codes documented in this report are preliminary and upon coder review may  be revised to meet current compliance requirements. Lucilla Lame MD, MD 04/12/2020 10:11:49 AM This report has been signed electronically. Number of Addenda: 0 Note Initiated On: 04/12/2020 9:53 AM Scope Withdrawal Time: 0 hours 6 minutes 28 seconds  Total Procedure Duration: 0 hours 9 minutes 42 seconds   Estimated Blood Loss:  Estimated blood loss: none.      [facility]

## 2020-04-12 NOTE — Anesthesia Preprocedure Evaluation (Signed)
Anesthesia Evaluation  Patient identified by MRN, date of birth, ID band Patient awake    History of Anesthesia Complications Negative for: history of anesthetic complications  Airway Mallampati: I  TM Distance: >3 FB Neck ROM: Full    Dental no notable dental hx.    Pulmonary neg pulmonary ROS, former smoker,    Pulmonary exam normal        Cardiovascular negative cardio ROS Normal cardiovascular exam     Neuro/Psych    GI/Hepatic negative GI ROS, Neg liver ROS,   Endo/Other  negative endocrine ROS  Renal/GU negative Renal ROS     Musculoskeletal   Abdominal   Peds  Hematology negative hematology ROS (+)   Anesthesia Other Findings   Reproductive/Obstetrics                             Anesthesia Physical Anesthesia Plan  ASA: I  Anesthesia Plan: General   Post-op Pain Management:    Induction: Intravenous  PONV Risk Score and Plan: 3 and Midazolam, TIVA and Treatment may vary due to age or medical condition  Airway Management Planned: Nasal Cannula and Natural Airway  Additional Equipment: None  Intra-op Plan:   Post-operative Plan:   Informed Consent: I have reviewed the patients History and Physical, chart, labs and discussed the procedure including the risks, benefits and alternatives for the proposed anesthesia with the patient or authorized representative who has indicated his/her understanding and acceptance.       Plan Discussed with: CRNA  Anesthesia Plan Comments:         Anesthesia Quick Evaluation

## 2020-04-13 ENCOUNTER — Encounter: Payer: Self-pay | Admitting: Gastroenterology

## 2020-05-02 NOTE — Progress Notes (Signed)
Annette Griffin, [place]   Chief Complaint  Patient presents with  . Contraception    IUD consult    HPI:      Annette Griffin is a 54 y.o. No obstetric history on file. whose LMP was Patient's last menstrual period was 04/12/2020 (exact date)., presents today for NP IUD consult for cycle control/BC.  Menses are monthly, last 5 days, heavy for for 1-1/2 days with clots, no BTB, mild dysmen. No vasomotor sx. Pt states all women in her family have periods till mid-50s. Hx of menorrhagia with thickened EM and anemia 2014. Neg EMB 4/14. Pt took Fe supp, felt better and bleeding improved.  No hx of HTN, DVTs, migraines, CVA. Did OCPs 30 yrs ago without side effects. She is sex active, using condoms. No pain/bleeding.   WNL CBC, low ferritin 5/21 with PCP Last pap 03/12/20 neg cells/neg HPV DNA Last mammo 6/21  Past Medical History:  Diagnosis Date  . Anemia   . Depression   . Motion sickness    boats    Past Surgical History:  Procedure Laterality Date  . COLONOSCOPY WITH PROPOFOL N/A 04/12/2020   Procedure: COLONOSCOPY WITH PROPOFOL;  Surgeon: Lucilla Lame, MD;  Location: [place];  Service: Endoscopy;  Laterality: N/A;  priority 4  . MOUTH SURGERY      Family History  Problem Relation Age of Onset  . Colon cancer Father   . Breast cancer Maternal Grandmother     Social History   Socioeconomic History  . Marital status: Married    Spouse name: Not on file  . Number of children: Not on file  . Years of education: Not on file  . Highest education level: Not on file  Occupational History  . Not on file  Tobacco Use  . Smoking status: Former Smoker    Quit date: 2000    Years since quitting: 21.5  . Smokeless tobacco: Never Used  Vaping Use  . Vaping Use: Never used  Substance and Sexual Activity  . Alcohol use: Yes    Alcohol/week: 10.0 standard drinks    Types: 10 Glasses of wine per week  . Drug use: Never  . Sexual activity: Yes  Other  Topics Concern  . Not on file  Social History Narrative  . Not on file   Social Determinants of Health   Financial Resource Strain:   . Difficulty of Paying Living Expenses:   Food Insecurity:   . Worried About Charity fundraiser in the Last Year:   . Arboriculturist in the Last Year:   Transportation Needs:   . Film/video editor (Medical):   Marland Kitchen Lack of Transportation (Non-Medical):   Physical Activity:   . Days of Exercise per Week:   . Minutes of Exercise per Session:   Stress:   . Feeling of Stress :   Social Connections:   . Frequency of Communication with Friends and Family:   . Frequency of Social Gatherings with Friends and Family:   . Attends Religious Services:   . Active Member of Clubs or Organizations:   . Attends Archivist Meetings:   Marland Kitchen Marital Status:   Intimate Partner Violence:   . Fear of Current or Ex-Partner:   . Emotionally Abused:   Marland Kitchen Physically Abused:   . Sexually Abused:     Outpatient Medications Prior to Visit  Medication Sig Dispense Refill  . ferrous sulfate 325 (65 FE) MG  EC tablet Take 325 mg by mouth daily.    . Multiple Vitamin (MULTIVITAMIN) tablet Take 1 tablet by mouth daily.     No facility-administered medications prior to visit.      ROS:  Review of Systems  Constitutional: Negative for fever.  Gastrointestinal: Negative for blood in stool, constipation, diarrhea, nausea and vomiting.  Genitourinary: Negative for dyspareunia, dysuria, flank pain, frequency, hematuria, urgency, vaginal bleeding, vaginal discharge and vaginal pain.  Musculoskeletal: Negative for back pain.  Skin: Negative for rash.    OBJECTIVE:   Vitals:  BP 110/70   Ht 5\' 8"  (1.727 m)   Wt 187 lb (84.8 kg)   LMP 04/12/2020 (Exact Date)   BMI 28.43 kg/m   Physical Exam Vitals reviewed.  Constitutional:      Appearance: She is well-developed.  Pulmonary:     Effort: Pulmonary effort is normal.  Genitourinary:    General: Normal  vulva.     Pubic Area: No rash.      Labia:        Right: No rash, tenderness or lesion.        Left: No rash, tenderness or lesion.      Vagina: Normal. No vaginal discharge, erythema or tenderness.     Cervix: Normal.  Musculoskeletal:        General: Normal range of motion.     Cervical back: Normal range of motion.  Skin:    General: Skin is warm and dry.  Neurological:     General: No focal deficit present.     Mental Status: She is alert and oriented to person, place, and time.  Psychiatric:        Mood and Affect: Mood normal.        Behavior: Behavior normal.        Thought Content: Thought content normal.        Judgment: Judgment normal.     Results: Results for orders placed or performed in visit on 05/03/20 (from the past 24 hour(s))  POCT urine pregnancy     Status: Normal   Collection Time: 05/03/20  9:55 AM  Result Value Ref Range   Preg Test, Ur Negative Negative    IUD Insertion Procedure Note Patient identified, informed consent performed, consent signed.   Discussed risks of irregular bleeding, cramping, infection, malpositioning or misplacement of the IUD outside the uterus which may require further procedure such as laparoscopy, risk of failure <1%. Time out was performed.  Urine pregnancy test negative.  Speculum placed in the vagina.  Cervix visualized.  Cleaned with Betadine x 2.  Grasped anteriorly with a single tooth tenaculum.  Uterus sounded to 10.0 cm.   IUD placed per manufacturer's recommendations.  Strings trimmed to 3 cm. Tenaculum was removed, good hemostasis noted.  Patient tolerated procedure well.    Assessment/Plan: Encounter for initial prescription of intrauterine contraceptive device (IUD); IUD pros/cons/risks/benefits discussed. Pt elects to proceed today after neg UPT.   Menorrhagia with regular cycle--try Mirena. F/u prn  Encounter for IUD insertion - Plan: POCT urine pregnancy, levonorgestrel (MIRENA, 52 MG,) 20 MCG/24HR  IUD  Patient was given post-procedure instructions.  She was advised to have backup contraception for one week.   Call if you are having increasing pain, cramps or bleeding or if you have a fever greater than 100.4 degrees F., shaking chills, nausea or vomiting. Patient was also asked to check IUD strings periodically and follow up in 4 weeks for IUD check.  Meds ordered  this encounter  Medications  . levonorgestrel (MIRENA, 52 MG,) 20 MCG/24HR IUD    Sig: 1 Intra Uterine Device (1 each total) by Intrauterine route once for 1 dose.    Dispense:  1 Intra Uterine Device    Refill:  0    Order Specific Question:   Supervising Provider    Answer:   Gae Dry [[id number]]      Return in about 4 weeks (around 05/31/2020) for IUD f/u.  Tzvi Economou B. Diani Jillson, PA-C 05/03/2020 9:56 AM

## 2020-05-03 ENCOUNTER — Other Ambulatory Visit: Payer: Self-pay

## 2020-05-03 ENCOUNTER — Ambulatory Visit (INDEPENDENT_AMBULATORY_CARE_PROVIDER_SITE_OTHER): Payer: No Typology Code available for payment source | Admitting: Obstetrics and Gynecology

## 2020-05-03 ENCOUNTER — Encounter: Payer: Self-pay | Admitting: Obstetrics and Gynecology

## 2020-05-03 VITALS — BP 110/70 | Ht 68.0 in | Wt 187.0 lb

## 2020-05-03 DIAGNOSIS — Z30430 Encounter for insertion of intrauterine contraceptive device: Secondary | ICD-10-CM

## 2020-05-03 DIAGNOSIS — N920 Excessive and frequent menstruation with regular cycle: Secondary | ICD-10-CM

## 2020-05-03 DIAGNOSIS — Z30014 Encounter for initial prescription of intrauterine contraceptive device: Secondary | ICD-10-CM

## 2020-05-03 LAB — POCT URINE PREGNANCY: Preg Test, Ur: NEGATIVE

## 2020-05-03 MED ORDER — MIRENA (52 MG) 20 MCG/24HR IU IUD: 1.0000 | INTRAUTERINE_SYSTEM | Freq: Once | INTRAUTERINE | 0 refills | Status: AC

## 2020-05-03 NOTE — Patient Instructions (Signed)
I value your feedback and entrusting us with your care. If you get a [facility] patient survey, I would appreciate you taking the time to let us know about your experience today. Thank you!  As of September 25, 2019, your lab results will be released to your MyChart immediately, before I even have a chance to see them. Please give me time to review them and contact you if there are any abnormalities. Thank you for your patience.  

## 2020-05-28 NOTE — Patient Instructions (Signed)
I value your feedback and entrusting us with your care. If you get a [facility] patient survey, I would appreciate you taking the time to let us know about your experience today. Thank you!  As of September 25, 2019, your lab results will be released to your MyChart immediately, before I even have a chance to see them. Please give me time to review them and contact you if there are any abnormalities. Thank you for your patience.  

## 2020-05-28 NOTE — Progress Notes (Signed)
   Chief Complaint  Patient presents with  . IUD check     History of Present Illness:  Annette Griffin is a 54 y.o. that had a Mirena IUD placed approximately 1 month ago. Since that time, she has had daily bleeding.  IUD placed midcycle 05/03/20. Bleeding was not as heavy as regular periods and has slowed some recently, able to wear pantyliner now. Minimal dysmen. Hasn't been sex active since. No other side effects.   Review of Systems  Constitutional: Negative for fever.  Gastrointestinal: Negative for blood in stool, constipation, diarrhea, nausea and vomiting.  Genitourinary: Positive for vaginal bleeding. Negative for dyspareunia, dysuria, flank pain, frequency, hematuria, urgency, vaginal discharge and vaginal pain.  Musculoskeletal: Negative for back pain.  Skin: Negative for rash.    Physical Exam:  BP 100/70   Ht 5\' 8"  (1.727 m)   Wt 189 lb (85.7 kg)   LMP 05/03/2020 (Exact Date)   BMI 28.74 kg/m  Body mass index is 28.74 kg/m.  Pelvic exam:  Two IUD strings present seen coming from the cervical os. EGBUS, vaginal vault and cervix: within normal limits   Assessment:   Encounter for routine checking of intrauterine contraceptive device (IUD)--IUD strings present in proper location; pt doing well  Breakthrough bleeding with IUD--Reassurance. Give IUD more time to work. Flow is lighter than before IUD. F/u in 2 months for GYN u/s if still has frequent BTB/sooner prn.   Plan: F/u if any signs of infection or can no longer feel the strings.   Cami Delawder B. Sareen Randon, PA-C 05/31/2020 8:53 AM

## 2020-05-31 ENCOUNTER — Encounter: Payer: Self-pay | Admitting: Obstetrics and Gynecology

## 2020-05-31 ENCOUNTER — Other Ambulatory Visit: Payer: Self-pay

## 2020-05-31 ENCOUNTER — Ambulatory Visit (INDEPENDENT_AMBULATORY_CARE_PROVIDER_SITE_OTHER): Payer: Self-pay | Admitting: Obstetrics and Gynecology

## 2020-05-31 VITALS — BP 100/70 | Ht 68.0 in | Wt 189.0 lb

## 2020-05-31 DIAGNOSIS — Z30431 Encounter for routine checking of intrauterine contraceptive device: Secondary | ICD-10-CM

## 2020-05-31 DIAGNOSIS — N921 Excessive and frequent menstruation with irregular cycle: Secondary | ICD-10-CM

## 2020-05-31 DIAGNOSIS — Z975 Presence of (intrauterine) contraceptive device: Secondary | ICD-10-CM

## 2020-06-04 ENCOUNTER — Encounter: Payer: Self-pay | Admitting: Family Medicine

## 2020-06-16 ENCOUNTER — Other Ambulatory Visit: Payer: Self-pay | Admitting: Dermatology

## 2020-06-16 ENCOUNTER — Other Ambulatory Visit: Payer: Self-pay

## 2020-06-16 ENCOUNTER — Ambulatory Visit (INDEPENDENT_AMBULATORY_CARE_PROVIDER_SITE_OTHER): Payer: No Typology Code available for payment source | Admitting: Dermatology

## 2020-06-16 DIAGNOSIS — L821 Other seborrheic keratosis: Secondary | ICD-10-CM

## 2020-06-16 DIAGNOSIS — L820 Inflamed seborrheic keratosis: Secondary | ICD-10-CM | POA: Diagnosis not present

## 2020-06-16 DIAGNOSIS — Z1283 Encounter for screening for malignant neoplasm of skin: Secondary | ICD-10-CM | POA: Diagnosis not present

## 2020-06-16 DIAGNOSIS — D229 Melanocytic nevi, unspecified: Secondary | ICD-10-CM | POA: Diagnosis not present

## 2020-06-16 DIAGNOSIS — L578 Other skin changes due to chronic exposure to nonionizing radiation: Secondary | ICD-10-CM

## 2020-06-16 DIAGNOSIS — L814 Other melanin hyperpigmentation: Secondary | ICD-10-CM

## 2020-06-16 DIAGNOSIS — D225 Melanocytic nevi of trunk: Secondary | ICD-10-CM

## 2020-06-16 DIAGNOSIS — D1800 Hemangioma unspecified site: Secondary | ICD-10-CM | POA: Diagnosis not present

## 2020-06-16 DIAGNOSIS — D485 Neoplasm of uncertain behavior of skin: Secondary | ICD-10-CM

## 2020-06-16 DIAGNOSIS — Z86018 Personal history of other benign neoplasm: Secondary | ICD-10-CM

## 2020-06-16 DIAGNOSIS — D239 Other benign neoplasm of skin, unspecified: Secondary | ICD-10-CM

## 2020-06-16 HISTORY — DX: Other benign neoplasm of skin, unspecified: D23.9

## 2020-06-16 NOTE — Progress Notes (Signed)
New Patient Visit Referral from Annette Griffin, Annette Griffin is a 54 y.o. female who presents for the following: Annual Exam (History of dysplastic nevi - treated in Annette Griffin at Dr Elveria Rising office. TBSE today). The patient presents for Total-Body Skin Exam (TBSE) for skin cancer screening and mole check.  The following portions of the chart were reviewed this encounter and updated as appropriate:  Tobacco  Allergies  Meds  Problems  Med Hx  Surg Hx  Fam Hx     Review of Systems:  No other skin or systemic complaints except as noted in HPI or Assessment and Plan.  Objective  Well appearing patient in no apparent distress; mood and affect are within normal limits.  A full examination was performed including scalp, head, eyes, ears, nose, lips, neck, chest, axillae, abdomen, back, buttocks, bilateral upper extremities, bilateral lower extremities, hands, feet, fingers, toes, fingernails, and toenails. All findings within normal limits unless otherwise noted below.  Objective  Chest - [facility]), Left Preauricular Area, Left medial canthus: Erythematous keratotic or waxy stuck-on papule or plaque.   Objective  Right buttock: Regular dark brown macules.  Images    Objective  Right epigastric: 0.7 x 0.4 cm irregular brown macule      Assessment & Plan    Lentigines - Scattered tan macules - Discussed due to sun exposure - Benign, observe - Call for any changes  Seborrheic Keratoses - Stuck-on, waxy, tan-brown papules and plaques  - Discussed benign etiology and prognosis. - Observe - Call for any changes  Melanocytic Nevi - Tan-brown and/or pink-flesh-colored symmetric macules and papules - Benign appearing on exam today - Observation - Call clinic for new or changing moles - Recommend daily use of broad spectrum spf 30+ sunscreen to sun-exposed areas.   Hemangiomas - Red papules - Discussed benign nature - Observe - Call for any  changes  Actinic Damage - diffuse scaly erythematous macules with underlying dyspigmentation - Recommend daily broad spectrum sunscreen SPF 30+ to sun-exposed areas, reapply every 2 hours as needed.  - Call for new or changing lesions.  Skin cancer screening performed today.  History of Dysplastic Nevi - No evidence of recurrence today - Recommend regular full body skin exams - Recommend daily broad spectrum sunscreen SPF 30+ to sun-exposed areas, reapply every 2 hours as needed.  - Call if any new or changing lesions are noted between office visits  Inflamed seborrheic keratosis (3) Chest - [facility]); Left Preauricular Area; Left medial canthus  Destruction of lesion - Left medial canthus Complexity: simple   Destruction method: cryotherapy   Informed consent: discussed and consent obtained   Timeout:  patient name, date of birth, surgical site, and procedure verified Lesion destroyed using liquid nitrogen: Yes   Region frozen until ice ball extended beyond lesion: Yes   Outcome: patient tolerated procedure well with no complications   Post-procedure details: wound care instructions given    Nevus Right buttock Dark Benign appearing. Recheck on follow up.  Neoplasm of uncertain behavior of skin Right epigastric  Epidermal / dermal shaving  Lesion diameter (cm):  0.7 Informed consent: discussed and consent obtained   Timeout: patient name, date of birth, surgical site, and procedure verified   Procedure prep:  Patient was prepped and draped in usual sterile fashion Prep type:  Isopropyl alcohol Anesthesia: the lesion was anesthetized in a standard fashion   Anesthetic:  1% lidocaine w/ epinephrine 1-100,000 buffered w/ 8.4% NaHCO3 Instrument  used: flexible razor blade   Hemostasis achieved with: pressure, aluminum chloride and electrodesiccation   Outcome: patient tolerated procedure well   Post-procedure details: sterile dressing applied and wound care  instructions given   Dressing type: bandage and petrolatum    Specimen 1 - Surgical pathology Differential Diagnosis: Nevus vs dysplastic nevus Check Margins: No 0.7 x 0.4 cm irregular brown macule  Return in about 6 months (around 12/14/2020).  I, Ashok Cordia, CMA, am acting as scribe for [name], MD .  Documentation: I have reviewed the above documentation for accuracy and completeness, and I agree with the above.  [name], MD

## 2020-06-16 NOTE — Patient Instructions (Signed)

## 2020-06-21 ENCOUNTER — Encounter: Payer: Self-pay | Admitting: Dermatology

## 2020-06-22 ENCOUNTER — Telehealth: Payer: Self-pay

## 2020-06-22 NOTE — Telephone Encounter (Signed)
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

## 2020-06-22 NOTE — Telephone Encounter (Signed)
Patient advised of BX results.  °

## 2020-06-22 NOTE — Telephone Encounter (Signed)
LM on VM to return my call. 

## 2020-12-17 ENCOUNTER — Encounter: Payer: Self-pay | Admitting: Nurse Practitioner

## 2020-12-17 ENCOUNTER — Other Ambulatory Visit: Payer: Self-pay

## 2020-12-17 ENCOUNTER — Ambulatory Visit (INDEPENDENT_AMBULATORY_CARE_PROVIDER_SITE_OTHER): Payer: Self-pay | Admitting: Nurse Practitioner

## 2020-12-17 VITALS — BP 173/108 | HR 81 | Temp 98.3°F

## 2020-12-17 DIAGNOSIS — F321 Major depressive disorder, single episode, moderate: Secondary | ICD-10-CM

## 2020-12-17 MED ORDER — VENLAFAXINE HCL ER 37.5 MG PO CP24: 37.5000 mg | ORAL_CAPSULE | Freq: Every day | ORAL | 0 refills | Status: DC

## 2020-12-17 NOTE — Progress Notes (Signed)
BP (!) 173/108   Pulse 81   Temp 98.3 F (36.8 C)   SpO2 95%    Subjective:    Patient ID: Annette Griffin, female    DOB: 12/17/1965, 55 y.o.   MRN: [id number]  HPI: Annette Griffin is a 55 y.o. female  Chief Complaint  Patient presents with  . Depression   DEPRESSION Patient has had depression in the past.  When she was in her 80s she took Prozac.  Mood status: worse Satisfied with current treatment?: no current treatment Symptom severity: moderate  Duration of current treatment : came off prozac after about being on her in 2 years. Side effects: no  Medication compliance: no meds Psychotherapy/counseling: no in the past Previous psychiatric medications: prozac Depressed mood: yes Anxious mood: yes Anhedonia: no Significant weight loss or gain: no Insomnia: Patient finds that she is not resting well and tired during the day.  hard to stay asleep Fatigue: yes Feelings of worthlessness or guilt: yes Impaired concentration/indecisiveness: no Suicidal ideations: no Hopelessness: yes Crying spells: yes Depression screen [facility] 2/9 03/12/2020  Decreased Interest 1  Down, Depressed, Hopeless 0  PHQ - 2 Score 1  Altered sleeping 3  Tired, decreased energy 3  Change in appetite 3  Feeling bad or failure about yourself  1  Trouble concentrating 0  Moving slowly or fidgety/restless 0  Suicidal thoughts 0  PHQ-9 Score 11   GAD 7 : Generalized Anxiety Score 03/12/2020  Nervous, Anxious, on Edge 2  Control/stop worrying 2  Worry too much - different things 2  Trouble relaxing 2  Restless 0  Easily annoyed or irritable 1  Afraid - awful might happen 0  Total GAD 7 Score 9  Anxiety Difficulty Not difficult at all     Relevant past medical, surgical, family and social history reviewed and updated as indicated. Interim medical history since our last visit reviewed. Allergies and medications reviewed and updated.  Review of Systems  Psychiatric/Behavioral: Positive for  dysphoric mood and sleep disturbance. Negative for decreased concentration and suicidal ideas. The patient is nervous/anxious.     Per HPI unless specifically indicated above     Objective:    BP (!) 173/108   Pulse 81   Temp 98.3 F (36.8 C)   SpO2 95%   Wt Readings from Last 3 Encounters:  05/31/20 189 lb (85.7 kg)  05/03/20 187 lb (84.8 kg)  04/12/20 180 lb (81.6 kg)    Physical Exam Vitals and nursing note reviewed.  Constitutional:      General: She is not in acute distress.    Appearance: Normal appearance. She is normal weight. She is not ill-appearing, toxic-appearing or diaphoretic.  HENT:     Head: Normocephalic.     Right Ear: External ear normal.     Left Ear: External ear normal.     Nose: Nose normal.     Mouth/Throat:     Mouth: Mucous membranes are moist.     Pharynx: Oropharynx is clear.  Eyes:     General:        Right eye: No discharge.        Left eye: No discharge.     Extraocular Movements: Extraocular movements intact.     Conjunctiva/sclera: Conjunctivae normal.     Pupils: Pupils are equal, round, and reactive to light.  Cardiovascular:     Rate and Rhythm: Normal rate and regular rhythm.     Heart sounds: No murmur heard.  Pulmonary:     Effort: Pulmonary effort is normal. No respiratory distress.     Breath sounds: Normal breath sounds. No wheezing or rales.  Musculoskeletal:     Cervical back: Normal range of motion and neck supple.  Skin:    General: Skin is warm and dry.     Capillary Refill: Capillary refill takes less than 2 seconds.  Neurological:     General: No focal deficit present.     Mental Status: She is alert and oriented to person, place, and time. Mental status is at baseline.  Psychiatric:        Mood and Affect: Mood normal.        Behavior: Behavior normal.        Thought Content: Thought content normal.        Judgment: Judgment normal.     Results for orders placed or performed in visit on 05/03/20  POCT  urine pregnancy  Result Value Ref Range   Preg Test, Ur Negative Negative      Assessment & Plan:   Problem List Items Addressed This Visit   None   Visit Diagnoses    Depression, major, single episode, moderate ([place])    -  Primary   Start Effexor daily. Side effects and benefits of medicaiton discussed with patient during visit. Will titrate up as needed. Return to [facility] in 2 weeks.   Relevant Medications   venlafaxine XR (EFFEXOR XR) 37.5 MG 24 hr capsule       Follow up plan: Return in about 2 weeks (around 12/31/2020) for Depression/Anxiety FU.   A total of 30 minutes were spent on this encounter today.  When total time is documented, this includes both the face-to-face and non-face-to-face time personally spent before, during and after the visit on the date of the encounter.

## 2020-12-20 ENCOUNTER — Ambulatory Visit: Payer: No Typology Code available for payment source | Admitting: Dermatology

## 2021-01-03 ENCOUNTER — Encounter: Payer: Self-pay | Admitting: Nurse Practitioner

## 2021-01-03 ENCOUNTER — Ambulatory Visit (INDEPENDENT_AMBULATORY_CARE_PROVIDER_SITE_OTHER): Payer: Self-pay | Admitting: Nurse Practitioner

## 2021-01-03 ENCOUNTER — Other Ambulatory Visit: Payer: Self-pay

## 2021-01-03 VITALS — BP 134/86 | HR 69 | Temp 98.5°F | Wt 180.0 lb

## 2021-01-03 DIAGNOSIS — F321 Major depressive disorder, single episode, moderate: Secondary | ICD-10-CM | POA: Diagnosis not present

## 2021-01-03 MED ORDER — VENLAFAXINE HCL ER 37.5 MG PO CP24: 37.5000 mg | ORAL_CAPSULE | Freq: Every day | ORAL | 1 refills | Status: DC

## 2021-01-03 NOTE — Assessment & Plan Note (Addendum)
Chronic.  Ongoing.  Significantly improved since last visit.  Discussed with patient that the medication takes 4-6 weeks.  Will continue with current dose at this time.  Follow up in 2 months for reevaluation.  Refill sent today.

## 2021-01-03 NOTE — Progress Notes (Signed)
BP 134/86   Pulse 69   Temp 98.5 F (36.9 C)   Wt 180 lb (81.6 kg)   SpO2 98%   BMI 27.37 kg/m    Subjective:    Patient ID: Annette Griffin, female    DOB: [birth year], 55 y.o.   MRN: [id number]  HPI: Annette Griffin is a 55 y.o. female  Chief Complaint  Patient presents with  . Anxiety  . Depression   DEPRESSION Patient has had depression in the past.  When she was in her 63s she took Prozac. Patient started on Effexor 37.5mg  at last visit.  Patient reports medication is has helped her a lot.  She still feels some anxious moments but states that has improved. Mood status: better Satisfied with current treatment?: yes Symptom severity: moderate  Duration of current treatment : 2 weeks Side effects: no  Medication compliance: Excellent Psychotherapy/counseling: no in the past Previous psychiatric medications: prozac Depressed mood: yes Anxious mood: yes Anhedonia: no Significant weight loss or gain: no Insomnia: Patient finds that she is not resting well and tired during the day.  hard to stay asleep Fatigue: yes Feelings of worthlessness or guilt: yes Impaired concentration/indecisiveness: no Suicidal ideations: no Hopelessness: improved Crying spells: improved Depression screen [facility] 2/9 01/03/2021 12/17/2020 03/12/2020  Decreased Interest 0 3 1  Down, Depressed, Hopeless 0 3 0  PHQ - 2 Score 0 6 1  Altered sleeping 0 3 3  Tired, decreased energy 3 3 3   Change in appetite 3 3 3   Feeling bad or failure about yourself  0 3 1  Trouble concentrating 0 3 0  Moving slowly or fidgety/restless 0 1 0  Suicidal thoughts 0 2 0  PHQ-9 Score 6 24 11   Difficult doing work/chores Not difficult at all Somewhat difficult -   GAD 7 : Generalized Anxiety Score 01/03/2021 12/17/2020 03/12/2020  Nervous, Anxious, on Edge 3 3 2   Control/stop worrying 0 1 2  Worry too much - different things 0 1 2  Trouble relaxing 0 3 2  Restless 0 1 0  Easily annoyed or irritable 0 1 1  Afraid -  awful might happen 0 0 0  Total GAD 7 Score 3 10 9   Anxiety Difficulty Not difficult at all Extremely difficult Not difficult at all     Relevant past medical, surgical, family and social history reviewed and updated as indicated. Interim medical history since our last visit reviewed. Allergies and medications reviewed and updated.  Review of Systems  Psychiatric/Behavioral: Positive for dysphoric mood. Negative for decreased concentration, sleep disturbance and suicidal ideas. The patient is nervous/anxious.     Per HPI unless specifically indicated above     Objective:    BP 134/86   Pulse 69   Temp 98.5 F (36.9 C)   Wt 180 lb (81.6 kg)   SpO2 98%   BMI 27.37 kg/m   Wt Readings from Last 3 Encounters:  01/03/21 180 lb (81.6 kg)  05/31/20 189 lb (85.7 kg)  05/03/20 187 lb (84.8 kg)    Physical Exam Vitals and nursing note reviewed.  Constitutional:      General: She is not in acute distress.    Appearance: Normal appearance. She is normal weight. She is not ill-appearing, toxic-appearing or diaphoretic.  HENT:     Head: Normocephalic.     Right Ear: External ear normal.     Left Ear: External ear normal.     Nose: Nose normal.  Mouth/Throat:     Mouth: Mucous membranes are moist.     Pharynx: Oropharynx is clear.  Eyes:     General:        Right eye: No discharge.        Left eye: No discharge.     Extraocular Movements: Extraocular movements intact.     Conjunctiva/sclera: Conjunctivae normal.     Pupils: Pupils are equal, round, and reactive to light.  Cardiovascular:     Rate and Rhythm: Normal rate and regular rhythm.     Heart sounds: No murmur heard.   Pulmonary:     Effort: Pulmonary effort is normal. No respiratory distress.     Breath sounds: Normal breath sounds. No wheezing or rales.  Musculoskeletal:     Cervical back: Normal range of motion and neck supple.  Skin:    General: Skin is warm and dry.     Capillary Refill: Capillary refill  takes less than 2 seconds.  Neurological:     General: No focal deficit present.     Mental Status: She is alert and oriented to person, place, and time. Mental status is at baseline.  Psychiatric:        Mood and Affect: Mood normal.        Behavior: Behavior normal.        Thought Content: Thought content normal.        Judgment: Judgment normal.     Results for orders placed or performed in visit on 05/03/20  POCT urine pregnancy  Result Value Ref Range   Preg Test, Ur Negative Negative      Assessment & Plan:   Problem List Items Addressed This Visit      Other   Depression, major, single episode, moderate ([place]) - Primary    Chronic.  Ongoing.  Significantly improved since last visit.  Discussed with patient that the medication takes 4-6 weeks.  Will continue with current dose at this time.  Follow up in 2 months for reevaluation.  Refill sent today.       Relevant Medications   venlafaxine XR (EFFEXOR XR) 37.5 MG 24 hr capsule       Follow up plan: Return in about 2 months (around 03/05/2021) for Depression/Anxiety FU.   A total of 20 minutes were spent on this encounter today.  When total time is documented, this includes both the face-to-face and non-face-to-face time personally spent before, during and after the visit on the date of the encounter.

## 2021-02-16 ENCOUNTER — Ambulatory Visit
Admission: RE | Admit: 2021-02-16 | Discharge: 2021-02-16 | Disposition: A | Discharge status: Home / Self Care | Payer: Self-pay | Source: Ambulatory Visit | Attending: Nurse Practitioner | Admitting: Nurse Practitioner

## 2021-02-16 ENCOUNTER — Observation Stay
Admission: EM | Admit: 2021-02-16 | Discharge: 2021-02-17 | Disposition: A | Discharge status: Home / Self Care | Payer: Self-pay | Attending: General Surgery | Admitting: General Surgery

## 2021-02-16 ENCOUNTER — Ambulatory Visit (INDEPENDENT_AMBULATORY_CARE_PROVIDER_SITE_OTHER): Payer: Self-pay | Admitting: Nurse Practitioner

## 2021-02-16 ENCOUNTER — Encounter: Payer: Self-pay | Admitting: Nurse Practitioner

## 2021-02-16 ENCOUNTER — Other Ambulatory Visit: Payer: Self-pay

## 2021-02-16 ENCOUNTER — Ambulatory Visit: Payer: Self-pay | Admitting: *Deleted

## 2021-02-16 VITALS — BP 132/85 | HR 70 | Temp 97.2°F | Wt 181.5 lb

## 2021-02-16 DIAGNOSIS — R1031 Right lower quadrant pain: Secondary | ICD-10-CM | POA: Diagnosis not present

## 2021-02-16 DIAGNOSIS — K3580 Unspecified acute appendicitis: Principal | ICD-10-CM | POA: Insufficient documentation

## 2021-02-16 DIAGNOSIS — Z87891 Personal history of nicotine dependence: Secondary | ICD-10-CM | POA: Insufficient documentation

## 2021-02-16 DIAGNOSIS — Z20822 Contact with and (suspected) exposure to covid-19: Secondary | ICD-10-CM | POA: Insufficient documentation

## 2021-02-16 DIAGNOSIS — K3589 Other acute appendicitis without perforation or gangrene: Secondary | ICD-10-CM

## 2021-02-16 LAB — URINALYSIS, COMPLETE (UACMP) WITH MICROSCOPIC
Bilirubin Urine: NEGATIVE
Glucose, UA: NEGATIVE mg/dL
Ketones, ur: 5 mg/dL — AB
Leukocytes,Ua: NEGATIVE
Nitrite: NEGATIVE
Protein, ur: NEGATIVE mg/dL
Specific Gravity, Urine: 1.002 — ABNORMAL LOW (ref 1.005–1.030)
pH: 7 (ref 5.0–8.0)

## 2021-02-16 LAB — CBC
HCT: 42.5 % (ref 36.0–46.0)
Hemoglobin: 14.1 g/dL (ref 12.0–15.0)
MCH: 32.1 pg (ref 26.0–34.0)
MCHC: 33.2 g/dL (ref 30.0–36.0)
MCV: 96.8 fL (ref 80.0–100.0)
Platelets: 278 10*3/uL (ref 150–400)
RBC: 4.39 MIL/uL (ref 3.87–5.11)
RDW: 12.1 % (ref 11.5–15.5)
WBC: 9.9 10*3/uL (ref 4.0–10.5)
nRBC: 0 % (ref 0.0–0.2)

## 2021-02-16 LAB — COMPREHENSIVE METABOLIC PANEL
ALT: 19 U/L (ref 0–44)
AST: 20 U/L (ref 15–41)
Albumin: 4.6 g/dL (ref 3.5–5.0)
Alkaline Phosphatase: 54 U/L (ref 38–126)
Anion gap: 10 (ref 5–15)
BUN: 8 mg/dL (ref 6–20)
CO2: 24 mmol/L (ref 22–32)
Calcium: 9.2 mg/dL (ref 8.9–10.3)
Chloride: 102 mmol/L (ref 98–111)
Creatinine, Ser: 0.58 mg/dL (ref 0.44–1.00)
GFR, Estimated: >60 mL/min (ref >60)
Glucose, Bld: 81 mg/dL (ref 70–99)
Potassium: 3.6 mmol/L (ref 3.5–5.1)
Sodium: 136 mmol/L (ref 135–145)
Total Bilirubin: 0.8 mg/dL (ref 0.3–1.2)
Total Protein: 7.5 g/dL (ref 6.5–8.1)

## 2021-02-16 LAB — RESP PANEL BY RT-PCR (FLU A&B, COVID) ARPGX2
Influenza A by PCR: NEGATIVE
Influenza B by PCR: NEGATIVE
SARS Coronavirus 2 by RT PCR: NEGATIVE

## 2021-02-16 LAB — POC URINE PREG, ED: Preg Test, Ur: NEGATIVE

## 2021-02-16 LAB — LIPASE, BLOOD: Lipase: 31 U/L (ref 11–51)

## 2021-02-16 MED ORDER — ONDANSETRON HCL 4 MG/2ML IJ SOLN
4.0000 mg | Freq: Four times a day (QID) | INTRAMUSCULAR | Status: DC | PRN
  Administered 2021-02-16: 4 mg via INTRAVENOUS

## 2021-02-16 MED ORDER — MORPHINE SULFATE (PF) 2 MG/ML IV SOLN
2.0000 mg | INTRAVENOUS | Status: DC | PRN
  Administered 2021-02-16: 2 mg via INTRAVENOUS
  Administered 2021-02-16: 4 mg via INTRAVENOUS
  Filled 2021-02-16: qty 2

## 2021-02-16 MED ORDER — SODIUM CHLORIDE 0.9 % IV BOLUS
1000.0000 mL | Freq: Once | INTRAVENOUS | Status: AC
  Administered 2021-02-16: 1000 mL via INTRAVENOUS

## 2021-02-16 MED ORDER — ONDANSETRON 4 MG PO TBDP: 4.0000 mg | ORAL_TABLET | Freq: Four times a day (QID) | ORAL | Status: DC | PRN

## 2021-02-16 MED ORDER — PIPERACILLIN-TAZOBACTAM 3.375 G IVPB
3.3750 g | Freq: Three times a day (TID) | INTRAVENOUS | Status: DC
  Administered 2021-02-16 – 2021-02-17 (×2): 3.375 g via INTRAVENOUS
  Filled 2021-02-16 (×2): qty 50

## 2021-02-16 MED ORDER — ONDANSETRON HCL 4 MG/2ML IJ SOLN
4.0000 mg | Freq: Once | INTRAMUSCULAR | Status: DC
  Filled 2021-02-16: qty 2

## 2021-02-16 MED ORDER — ACETAMINOPHEN 500 MG PO TABS
1000.0000 mg | ORAL_TABLET | Freq: Four times a day (QID) | ORAL | Status: DC
  Administered 2021-02-16 – 2021-02-17 (×2): 1000 mg via ORAL
  Filled 2021-02-16 (×3): qty 2

## 2021-02-16 MED ORDER — SODIUM CHLORIDE 0.9 % IV SOLN: INTRAVENOUS | Status: DC

## 2021-02-16 MED ORDER — MORPHINE SULFATE (PF) 4 MG/ML IV SOLN
4.0000 mg | Freq: Once | INTRAVENOUS | Status: DC
  Filled 2021-02-16: qty 1

## 2021-02-16 NOTE — Plan of Care (Signed)
  Problem: Health Behavior/Discharge Planning: Goal: Ability to manage health-related needs will improve Outcome: Progressing   Problem: Clinical Measurements: Goal: Will remain free from infection Outcome: Progressing   Problem: Coping: Goal: Level of anxiety will decrease Outcome: Progressing   Problem: Elimination: Goal: Will not experience complications related to bowel motility Outcome: Progressing Goal: Will not experience complications related to urinary retention Outcome: Progressing   Problem: Pain Managment: Goal: General experience of comfort will improve Outcome: Progressing   Problem: Safety: Goal: Ability to remain free from injury will improve Outcome: Progressing

## 2021-02-16 NOTE — H&P (Addendum)
[facility] SURGICAL HISTORY & PHYSICAL (cpt [phone])  HISTORY OF PRESENT ILLNESS (HPI):  55 y.o. female presented to [facility] ED today for abdominal pain. Patient reports around a 24 hour of relatively mild and generalized abdominal discomfort starting yesterday which she though may have been indigestion,. However, around 8 pm last night, she noticed the pain became more sharp, severe, and localized into her RLQ. This was made worse with ambulation. Nothing seemed to make it better. She endorses a decreased appetite and nausea with the pain. No fever, chills, cough, CP, SOB, urinary changes, or bowel changes. No history of similar. No previous intra-abdominal surgeries. She initially presented to her PCP this morning for this and underwent CT Abdomen/Pelvis which was concerning for appendicitis without perforation or abscess. She was then referred to the ED for management. Laboratory work up in the ED was reassuring and she is without leukocytosis.   General surgery is consulted by emergency medicine physician Dr Harvest Dark, MD for evaluation and management of acute uncomplicated appendicitis.    PAST MEDICAL HISTORY (PMH):  Past Medical History:  Diagnosis Date   Anemia    Depression    Dysplastic nevus 06/16/2020   R epigastric - mild    Motion sickness    boats    Reviewed. Otherwise negative.   PAST SURGICAL HISTORY ([place]):  Past Surgical History:  Procedure Laterality Date   COLONOSCOPY WITH PROPOFOL N/A 04/12/2020   Procedure: COLONOSCOPY WITH PROPOFOL;  Surgeon: Lucilla Lame, MD;  Location: [place];  Service: Endoscopy;  Laterality: N/A;  priority 4   MOUTH SURGERY      Reviewed. Otherwise negative.   MEDICATIONS:  Prior to Admission medications   Medication Sig Start Date End Date Taking? Authorizing Provider  ferrous sulfate 325 (65 FE) MG EC tablet Take 325 mg by mouth daily.   Yes [provider]  ibuprofen (ADVIL) 200 MG tablet Take  400 mg by mouth 3 (three) times daily.   Yes [provider]  Multiple Vitamin (MULTIVITAMIN) tablet Take 1 tablet by mouth daily.   Yes [provider]  venlafaxine XR (EFFEXOR XR) 37.5 MG 24 hr capsule Take 1 capsule (37.5 mg total) by mouth daily with breakfast. 01/03/21  Yes Jon Billings, NP  levonorgestrel (MIRENA, 52 MG,) 20 MCG/24HR IUD 1 Intra Uterine Device (1 each total) by Intrauterine route once for 1 dose. 05/03/20 7/32/20  Copland, Deirdre Evener, PA-C     ALLERGIES:  No Known Allergies   SOCIAL HISTORY:  Social History   Socioeconomic History   Marital status: Married    Spouse name: Not on file   Number of children: Not on file   Years of education: Not on file   Highest education level: Not on file  Occupational History   Not on file  Tobacco Use   Smoking status: Former Smoker    Quit date: 2000    Years since quitting: 22.3   Smokeless tobacco: Never Used  Scientific laboratory technician Use: Never used  Substance and Sexual Activity   Alcohol use: Yes    Alcohol/week: 10.0 standard drinks    Types: 10 Glasses of wine per week   Drug use: Never   Sexual activity: Yes    Birth control/protection: I.U.D.    Comment: Mirena  Other Topics Concern   Not on file  Social History Narrative   Not on file   Social Determinants of Health   Financial Resource Strain: Not on file  Food Insecurity: Not on file  Transportation Needs: Not on file  Physical Activity: Not on file  Stress: Not on file  Social Connections: Not on file  Intimate Partner Violence: Not on file     FAMILY HISTORY:  Family History  Problem Relation Age of Onset   Colon cancer Father    Breast cancer Maternal Grandmother     Otherwise negative.   REVIEW OF SYSTEMS:  Review of Systems  Constitutional: Negative for chills and fever.       + Decreased Appetite   Respiratory: Negative for cough and shortness of breath.   Cardiovascular: Negative for chest pain and palpitations.   Gastrointestinal: Positive for abdominal pain and nausea. Negative for vomiting.  Genitourinary: Negative for dysuria and urgency.  All other systems reviewed and are negative.   VITAL SIGNS:  Temp:  [97.2 F (36.2 C)-98.3 F (36.8 C)] 98.3 F (36.8 C) (05/04 1451) Pulse Rate:  [70-81] 81 (05/04 1451) Resp:  [18] 18 (05/04 1451) BP: (132-140)/(80-85) 140/80 (05/04 1451) SpO2:  [99 %-100 %] 99 % (05/04 1451) Weight:  [81.6 kg-82.3 kg] 81.6 kg (05/04 1453)     Height: 5\' 8"  (172.7 cm) Weight: 81.6 kg BMI (Calculated): 27.38   PHYSICAL EXAM:  Physical Exam Vitals and nursing note reviewed. Exam conducted with a chaperone present.  Constitutional:      General: She is not in acute distress.    Appearance: She is well-developed and normal weight. She is not ill-appearing.  HENT:     Head: Normocephalic and atraumatic.  Eyes:     General: No scleral icterus.    Extraocular Movements: Extraocular movements intact.  Cardiovascular:     Rate and Rhythm: Normal rate and regular rhythm.     Heart sounds: Normal heart sounds. No murmur heard.   Pulmonary:     Effort: Pulmonary effort is normal. No respiratory distress.  Abdominal:     General: There is no distension.     Palpations: Abdomen is soft.     Tenderness: There is abdominal tenderness in the right lower quadrant. There is no guarding or rebound. Positive signs include McBurney's sign. Negative signs include Murphy's sign, Rovsing's sign and psoas sign.     Comments: Abdomen is soft, RLQ tenderness with positive McBurney's Point, non-distended, no rebound/guarding  Genitourinary:    Comments: Deferred Skin:    General: Skin is warm and dry.     Coloration: Skin is not jaundiced or pale.  Neurological:     General: No focal deficit present.     Mental Status: She is alert and oriented to person, place, and time.  Psychiatric:        Mood and Affect: Mood normal.        Behavior: Behavior normal.     INTAKE/OUTPUT:   This shift: No intake/output data recorded.  Last 2 shifts: @IOLAST2SHIFTS @  Labs:  CBC Latest Ref Rng & Units 02/16/2021 03/12/2020  WBC 4.0 - 10.5 K/uL 9.9 5.1  Hemoglobin 12.0 - 15.0 g/dL 14.1 13.8  Hematocrit 36.0 - 46.0 % 42.5 41.8  Platelets 150 - 400 K/uL 278 256   CMP Latest Ref Rng & Units 02/16/2021 03/12/2020  Glucose 70 - 99 mg/dL 81 87  BUN 6 - 20 mg/dL 8 9  Creatinine 0.44 - 1.00 mg/dL 0.58 0.62  Sodium 135 - 145 mmol/L 136 141  Potassium 3.5 - 5.1 mmol/L 3.6 4.0  Chloride 98 - 111 mmol/L 102 101  CO2 22 - 32 mmol/L  24 22  Calcium 8.9 - 10.3 mg/dL 9.2 9.7  Total Protein 6.5 - 8.1 g/dL 7.5 7.0  Total Bilirubin 0.3 - 1.2 mg/dL 0.8 0.4  Alkaline Phos 38 - 126 U/L 54 51  AST 15 - 41 U/L 20 18  ALT 0 - 44 U/L 19 16     Imaging studies:   CT Abdomen/Pelvis (005/01/2021) personally reviewed concerning for acute uncomplicated appendicitis, no free air, no abscess, and radiologist report reviewed below:   IMPRESSION: 1. Findings are consistent with acute retrocecal appendicitis with surrounding inflammation. 2. No evidence of perforation, abscess or bowel obstruction. 3. Tiny nonobstructing calculus in the lower pole of the right kidney. Extrarenal right renal pelvis without evidence of ureteral obstruction.   Assessment/Plan: (ICD-10's: K35.80) 55 y.o. female with RLQ abdominal pain found to have acute uncomplicated appendicitis.    - Will admit to general surgery  - Plan for laparoscopic appendectomy with Dr Celine Ahr, pending OR/Anesthesia availability  - All risks, benefits, and alternatives to above procedure(s) were discussed with the patient and her husband at bedside, all of their questions were answered to their expressed satisfaction, patient expresses she wishes to proceed, and informed consent was obtained.  - NPO + IVF Resuscitation  - IV ABx (Zosyn)   - Monitor abdominal examination; on-going bowel function  - Pain control prn; antiemetics prn  - Morning  CBC / BMP   - Mobilization as tolerated   - DVT prophylaxis; hold for OR  All of the above findings and recommendations were discussed with the patient and her husband, and all of their questions were answered to their expressed satisfaction.  -- Edison Simon, PA-C [facility] 02/16/2021, 3:45 PM [phone] M-F: 7am - 4pm  I saw and evaluated the patient.  I agree with the above documentation, exam, and plan, which I have edited where appropriate. Fredirick Maudlin  10:21 AM

## 2021-02-16 NOTE — Progress Notes (Signed)
I called and spoke with patient to let her know the results of her CT. Patient was advised to go to the ED for evaluation and treatment.

## 2021-02-16 NOTE — Progress Notes (Signed)
BP 132/85   Pulse 70   Temp (!) 97.2 F (36.2 C)   Wt 181 lb 8 oz (82.3 kg)   SpO2 100%   BMI 27.60 kg/m    Subjective:    Patient ID: Annette Griffin, female    DOB: Nov 14, 1965, 55 y.o.   MRN: [id number]  HPI: Annette Griffin is a 55 y.o. female  Chief Complaint  Patient presents with  . Abdominal Pain    Right side above hip started at 8pm last night, patient has been up all night   ABDOMINAL ISSUES Nagging pain that has worsened in the last since 8pm.  Worse when she is walking.  Has not had anything to eat or drink since 5pm last night.  Duration: days started last night Nature: aching Location: RLQ  Severity: 4/10  Radiation: no Episode duration: Frequency: constant Alleviating factors: Aggravating factors: Treatments attempted: advil and has some relief Constipation: no Diarrhea: no Episodes of diarrhea/day: Mucous in the stool: no Heartburn: no Bloating:yes Flatulence: no Nausea: yes Vomiting: no Episodes of vomit/day: Melena or hematochezia: no Rash: no Jaundice: no Fever: no Weight loss: no    Denies HA, CP, SOB, dizziness, palpitations, visual changes, and lower extremity swelling.    Relevant past medical, surgical, family and social history reviewed and updated as indicated. Interim medical history since our last visit reviewed. Allergies and medications reviewed and updated.  Review of Systems  Eyes: Negative for visual disturbance.  Respiratory: Negative for cough, chest tightness and shortness of breath.   Cardiovascular: Negative for chest pain, palpitations and leg swelling.  Gastrointestinal: Positive for abdominal pain and nausea. Negative for constipation, diarrhea and vomiting.       Bloating  Neurological: Negative for dizziness and headaches.    Per HPI unless specifically indicated above     Objective:    BP 132/85   Pulse 70   Temp (!) 97.2 F (36.2 C)   Wt 181 lb 8 oz (82.3 kg)   SpO2 100%   BMI 27.60 kg/m   Wt  Readings from Last 3 Encounters:  02/16/21 181 lb 8 oz (82.3 kg)  01/03/21 180 lb (81.6 kg)  05/31/20 189 lb (85.7 kg)    Physical Exam Vitals and nursing note reviewed.  Constitutional:      General: She is not in acute distress.    Appearance: Normal appearance. She is normal weight. She is not ill-appearing, toxic-appearing or diaphoretic.  HENT:     Head: Normocephalic.     Right Ear: External ear normal.     Left Ear: External ear normal.     Nose: Nose normal.     Mouth/Throat:     Mouth: Mucous membranes are moist.     Pharynx: Oropharynx is clear.  Eyes:     General:        Right eye: No discharge.        Left eye: No discharge.     Extraocular Movements: Extraocular movements intact.     Conjunctiva/sclera: Conjunctivae normal.     Pupils: Pupils are equal, round, and reactive to light.  Cardiovascular:     Rate and Rhythm: Normal rate and regular rhythm.     Heart sounds: No murmur heard.   Pulmonary:     Effort: Pulmonary effort is normal. No respiratory distress.     Breath sounds: Normal breath sounds. No wheezing or rales.  Abdominal:     General: Bowel sounds are normal.  Tenderness: There is abdominal tenderness in the right lower quadrant. There is rebound. There is no guarding. Positive signs include McBurney's sign and obturator sign. Negative signs include Murphy's sign.  Musculoskeletal:     Cervical back: Normal range of motion and neck supple.  Skin:    General: Skin is warm and dry.     Capillary Refill: Capillary refill takes less than 2 seconds.  Neurological:     General: No focal deficit present.     Mental Status: She is alert and oriented to person, place, and time. Mental status is at baseline.  Psychiatric:        Mood and Affect: Mood normal.        Behavior: Behavior normal.        Thought Content: Thought content normal.        Judgment: Judgment normal.     Results for orders placed or performed in visit on 05/03/20  POCT  urine pregnancy  Result Value Ref Range   Preg Test, Ur Negative Negative      Assessment & Plan:   Problem List Items Addressed This Visit   None   Visit Diagnoses    Right lower quadrant abdominal pain    -  Primary   Patient had significant tenderness with palpation of RLQ. STAT CT of abdomen ordered. Will make recommendations based on imaging results.   Relevant Orders   CT Abdomen Pelvis Wo Contrast       Follow up plan: Return if symptoms worsen or fail to improve.    A total of 20 minutes were spent on this encounter today.  When total time is documented, this includes both the face-to-face and non-face-to-face time personally spent before, during and after the visit on the date of the encounter.

## 2021-02-16 NOTE — ED Notes (Signed)
Pa-c zac in with pt now for surgery

## 2021-02-16 NOTE — ED Triage Notes (Signed)
First Nurse Note:  Arrives after outpatient CT shown acute appendicitis.  Patient is AAOx3.  Skin warm and dry. NAD.  Posture upright and relaxed.  Gait steady.

## 2021-02-16 NOTE — Telephone Encounter (Signed)
Patient is calling to report she woke with abdominal pain last night- LRQ- it has continued throughout the night and is still present. Patient states she did have nausea this morning.  Reason for Disposition . [1] MILD-MODERATE pain AND [2] constant AND [3] present > 2 hours  Answer Assessment - Initial Assessment Questions 1. LOCATION: "Where does it hurt?"      LRQ 2. RADIATION: "Does the pain shoot anywhere else?" (e.g., chest, back)     Staying at area 3. ONSET: "When did the pain begin?" (e.g., minutes, hours or days ago)      8 pm last night 4. SUDDEN: "Gradual or sudden onset?"     gradual 5. PATTERN "Does the pain come and go, or is it constant?"    - If constant: "Is it getting better, staying the same, or worsening?"      (Note: Constant means the pain never goes away completely; most serious pain is constant and it progresses)     - If intermittent: "How long does it last?" "Do you have pain now?"     (Note: Intermittent means the pain goes away completely between bouts)     Constant- intensity has gotten better with Advil 6. SEVERITY: "How bad is the pain?"  (e.g., Scale 1-10; mild, moderate, or severe)   - MILD (1-3): doesn't interfere with normal activities, abdomen soft and not tender to touch    - MODERATE (4-7): interferes with normal activities or awakens from sleep, tender to touch    - SEVERE (8-10): excruciating pain, doubled over, unable to do any normal activities      moderate 7. RECURRENT SYMPTOM: "Have you ever had this type of stomach pain before?" If Yes, ask: "When was the last time?" and "What happened that time?"      no 8. CAUSE: "What do you think is causing the stomach pain?"     unsure 9. RELIEVING/AGGRAVATING FACTORS: "What makes it better or worse?" (e.g., movement, antacids, bowel movement)     Advil 10. OTHER SYMPTOMS: "Has there been any vomiting, diarrhea, constipation, or urine problems?"       nausea 11. PREGNANCY: "Is there any chance you  are pregnant?" "When was your last menstrual period?"       n/a  Protocols used: ABDOMINAL PAIN - [facility]

## 2021-02-16 NOTE — H&P (Incomplete Revision)
[facility] SURGICAL HISTORY & PHYSICAL (cpt [phone])  HISTORY OF PRESENT ILLNESS (HPI):  55 y.o. female presented to [facility] ED today for abdominal pain. Patient reports around a 24 hour of relatively mild and generalized abdominal discomfort starting yesterday which she though may have been indigestion,. However, around 8 pm last night, she noticed the pain became more sharp, severe, and localized into her RLQ. This was made worse with ambulation. Nothing seemed to make it better. She endorses a decreased appetite and nausea with the pain. No fever, chills, cough, CP, SOB, urinary changes, or bowel changes. No history of similar. No previous intra-abdominal surgeries. She initially presented to her PCP this morning for this and underwent CT Abdomen/Pelvis which was concerning for appendicitis without perforation or abscess. She was then referred to the ED for management. Laboratory work up in the ED was reassuring and she is without leukocytosis.   General surgery is consulted by emergency medicine physician Dr Harvest Dark, MD for evaluation and management of acute uncomplicated appendicitis.    PAST MEDICAL HISTORY (PMH):  Past Medical History:  Diagnosis Date  . Anemia   . Depression   . Dysplastic nevus 06/16/2020   R epigastric - mild   . Motion sickness    boats    Reviewed. Otherwise negative.   PAST SURGICAL HISTORY ([place]):  Past Surgical History:  Procedure Laterality Date  . COLONOSCOPY WITH PROPOFOL N/A 04/12/2020   Procedure: COLONOSCOPY WITH PROPOFOL;  Surgeon: Lucilla Lame, MD;  Location: [place];  Service: Endoscopy;  Laterality: N/A;  priority 4  . MOUTH SURGERY      Reviewed. Otherwise negative.   MEDICATIONS:  Prior to Admission medications   Medication Sig Start Date End Date Taking? Authorizing Provider  ferrous sulfate 325 (65 FE) MG EC tablet Take 325 mg by mouth daily.   Yes [provider]  ibuprofen (ADVIL) 200 MG tablet  Take 400 mg by mouth 3 (three) times daily.   Yes [provider]  Multiple Vitamin (MULTIVITAMIN) tablet Take 1 tablet by mouth daily.   Yes [provider]  venlafaxine XR (EFFEXOR XR) 37.5 MG 24 hr capsule Take 1 capsule (37.5 mg total) by mouth daily with breakfast. 01/03/21  Yes Jon Billings, NP  levonorgestrel (MIRENA, 52 MG,) 20 MCG/24HR IUD 1 Intra Uterine Device (1 each total) by Intrauterine route once for 1 dose. 05/03/20 01/12/91  Copland, Deirdre Evener, PA-C     ALLERGIES:  No Known Allergies   SOCIAL HISTORY:  Social History   Socioeconomic History  . Marital status: Married    Spouse name: Not on file  . Number of children: Not on file  . Years of education: Not on file  . Highest education level: Not on file  Occupational History  . Not on file  Tobacco Use  . Smoking status: Former Smoker    Quit date: 2000    Years since quitting: 22.3  . Smokeless tobacco: Never Used  Vaping Use  . Vaping Use: Never used  Substance and Sexual Activity  . Alcohol use: Yes    Alcohol/week: 10.0 standard drinks    Types: 10 Glasses of wine per week  . Drug use: Never  . Sexual activity: Yes    Birth control/protection: I.U.D.    Comment: Mirena  Other Topics Concern  . Not on file  Social History Narrative  . Not on file   Social Determinants of Health   Financial Resource Strain: Not on file  Food Insecurity: Not on file  Transportation Needs: Not on file  Physical Activity: Not on file  Stress: Not on file  Social Connections: Not on file  Intimate Partner Violence: Not on file     FAMILY HISTORY:  Family History  Problem Relation Age of Onset  . Colon cancer Father   . Breast cancer Maternal Grandmother     Otherwise negative.   REVIEW OF SYSTEMS:  Review of Systems  Constitutional: Negative for chills and fever.       + Decreased Appetite   Respiratory: Negative for cough and shortness of breath.   Cardiovascular: Negative for chest  pain and palpitations.  Gastrointestinal: Positive for abdominal pain and nausea. Negative for vomiting.  Genitourinary: Negative for dysuria and urgency.  All other systems reviewed and are negative.   VITAL SIGNS:  Temp:  [97.2 F (36.2 C)-98.3 F (36.8 C)] 98.3 F (36.8 C) (05/04 1451) Pulse Rate:  [70-81] 81 (05/04 1451) Resp:  [18] 18 (05/04 1451) BP: (132-140)/(80-85) 140/80 (05/04 1451) SpO2:  [99 %-100 %] 99 % (05/04 1451) Weight:  [81.6 kg-82.3 kg] 81.6 kg (05/04 1453)     Height: 5\' 8"  (172.7 cm) Weight: 81.6 kg BMI (Calculated): 27.38   PHYSICAL EXAM:  Physical Exam Vitals and nursing note reviewed. Exam conducted with a chaperone present.  Constitutional:      General: She is not in acute distress.    Appearance: She is well-developed and normal weight. She is not ill-appearing.  HENT:     Head: Normocephalic and atraumatic.  Eyes:     General: No scleral icterus.    Extraocular Movements: Extraocular movements intact.  Cardiovascular:     Rate and Rhythm: Normal rate and regular rhythm.     Heart sounds: Normal heart sounds. No murmur heard.   Pulmonary:     Effort: Pulmonary effort is normal. No respiratory distress.  Abdominal:     General: There is no distension.     Palpations: Abdomen is soft.     Tenderness: There is abdominal tenderness in the right lower quadrant. There is no guarding or rebound. Positive signs include McBurney's sign. Negative signs include Murphy's sign, Rovsing's sign and psoas sign.     Comments: Abdomen is soft, RLQ tenderness with positive McBurney's Point, non-distended, no rebound/guarding  Genitourinary:    Comments: Deferred Skin:    General: Skin is warm and dry.     Coloration: Skin is not jaundiced or pale.  Neurological:     General: No focal deficit present.     Mental Status: She is alert and oriented to person, place, and time.  Psychiatric:        Mood and Affect: Mood normal.        Behavior: Behavior normal.      INTAKE/OUTPUT:  This shift: No intake/output data recorded.  Last 2 shifts: @IOLAST2SHIFTS @  Labs:  CBC Latest Ref Rng & Units 02/16/2021 03/12/2020  WBC 4.0 - 10.5 K/uL 9.9 5.1  Hemoglobin 12.0 - 15.0 g/dL 14.1 13.8  Hematocrit 36.0 - 46.0 % 42.5 41.8  Platelets 150 - 400 K/uL 278 256   CMP Latest Ref Rng & Units 02/16/2021 03/12/2020  Glucose 70 - 99 mg/dL 81 87  BUN 6 - 20 mg/dL 8 9  Creatinine 0.44 - 1.00 mg/dL 0.58 0.62  Sodium 135 - 145 mmol/L 136 141  Potassium 3.5 - 5.1 mmol/L 3.6 4.0  Chloride 98 - 111 mmol/L 102 101  CO2 22 - 32 mmol/L  24 22  Calcium 8.9 - 10.3 mg/dL 9.2 9.7  Total Protein 6.5 - 8.1 g/dL 7.5 7.0  Total Bilirubin 0.3 - 1.2 mg/dL 0.8 0.4  Alkaline Phos 38 - 126 U/L 54 51  AST 15 - 41 U/L 20 18  ALT 0 - 44 U/L 19 16     Imaging studies:   CT Abdomen/Pelvis (005/01/2021) personally reviewed concerning for acute uncomplicated appendicitis, no free air, no abscess, and radiologist report reviewed below:  IMPRESSION: 1. Findings are consistent with acute retrocecal appendicitis with surrounding inflammation. 2. No evidence of perforation, abscess or bowel obstruction. 3. Tiny nonobstructing calculus in the lower pole of the right kidney. Extrarenal right renal pelvis without evidence of ureteral obstruction.   Assessment/Plan: (ICD-10's: K35.80) 55 y.o. female with RLQ abdominal pain found to have acute uncomplicated appendicitis.    - Will admit to general surgery  - Plan for laparoscopic appendectomy with Dr Celine Ahr, pending OR/Anesthesia availability  - All risks, benefits, and alternatives to above procedure(s) were discussed with the patient and her husband at bedside, all of their questions were answered to their expressed satisfaction, patient expresses she wishes to proceed, and informed consent was obtained.  - NPO + IVF Resuscitation  - IV ABx (Zosyn)   - Monitor abdominal examination; on-going bowel function  - Pain control prn;  antiemetics prn  - Morning CBC / BMP   - Mobilization as tolerated   - DVT prophylaxis; hold for OR  All of the above findings and recommendations were discussed with the patient and her husband, and all of their questions were answered to their expressed satisfaction.  -- Edison Simon, PA-C [facility] 02/16/2021, 3:45 PM [phone] M-F: 7am - 4pm

## 2021-02-16 NOTE — ED Triage Notes (Signed)
Pt was sent from [facility] with c/o RLQ pain since yesterday with nausea, had a CT scan here at [facility] today and was dx with appendicitis.

## 2021-02-16 NOTE — ED Notes (Signed)
Report messaged to zoia rn floor nurse

## 2021-02-16 NOTE — ED Notes (Signed)
Pt has right lower quad pain since yesterday.  Pt reports nausea.  Pt was sent to er from her doctor's office today for ct scan to rule out appendicitis.  No back pain.  No constipation or fever.  Pt alert speech clear.

## 2021-02-16 NOTE — ED Notes (Signed)
Iv started  meds given.  Iv infiltrated and removed when fluids were infusing.  Family with pt.

## 2021-02-16 NOTE — ED Provider Notes (Signed)
[facility] Emergency Department Provider Note  Time seen: 3:27 PM  I have reviewed the triage vital signs and the nursing notes.   HISTORY  Chief Complaint Abdominal Pain   HPI Annette Griffin is a 55 y.o. female with a past medical history of depression presents to the emergency department for acute appendicitis.  According to the patient for the past 2 to 3 days she has been experiencing pain in her right lower quadrant that worsened.  She called her doctor who arranged for an outpatient CT, she was called back saying it was positive for acute appendicitis of her to come to the emergency department.  Patient denies any fever denies any vomiting diarrhea dysuria.  Patient last ate last night last drink water early this morning.  Describes her discomfort as moderate aching type pain in the right lower quadrant.   Past Medical History:  Diagnosis Date  . Anemia   . Depression   . Dysplastic nevus 06/16/2020   R epigastric - mild   . Motion sickness    boats    Patient Active Problem List   Diagnosis Date Noted  . Depression, major, single episode, moderate ([place]) 01/03/2021  . Special screening for malignant neoplasms, colon   . Iron deficiency anemia 03/12/2020  . Anemia 02/03/2013  . Menorrhagia with regular cycle 02/03/2013    Past Surgical History:  Procedure Laterality Date  . COLONOSCOPY WITH PROPOFOL N/A 04/12/2020   Procedure: COLONOSCOPY WITH PROPOFOL;  Surgeon: Lucilla Lame, MD;  Location: [place];  Service: Endoscopy;  Laterality: N/A;  priority 4  . MOUTH SURGERY      Prior to Admission medications   Medication Sig Start Date End Date Taking? Authorizing Provider  ferrous sulfate 325 (65 FE) MG EC tablet Take 325 mg by mouth daily.    [provider]  levonorgestrel (MIRENA, 52 MG,) 20 MCG/24HR IUD 1 Intra Uterine Device (1 each total) by Intrauterine route once for 1 dose. 05/03/20 04/14/51  Copland, Deirdre Evener, PA-C   Multiple Vitamin (MULTIVITAMIN) tablet Take 1 tablet by mouth daily.    [provider]  venlafaxine XR (EFFEXOR XR) 37.5 MG 24 hr capsule Take 1 capsule (37.5 mg total) by mouth daily with breakfast. 01/03/21   Jon Billings, NP    No Known Allergies  Family History  Problem Relation Age of Onset  . Colon cancer Father   . Breast cancer Maternal Grandmother     Social History Social History   Tobacco Use  . Smoking status: Former Smoker    Quit date: 2000    Years since quitting: 22.3  . Smokeless tobacco: Never Used  Vaping Use  . Vaping Use: Never used  Substance Use Topics  . Alcohol use: Yes    Alcohol/week: 10.0 standard drinks    Types: 10 Glasses of wine per week  . Drug use: Never    Review of Systems Constitutional: Negative for fever. Cardiovascular: Negative for chest pain. Respiratory: Negative for shortness of breath. Gastrointestinal: Right lower quadrant abdominal pain.  Negative for nausea vomiting diarrhea Genitourinary: Negative for urinary compaints Musculoskeletal: Negative for musculoskeletal complaints Neurological: Negative for headache All other ROS negative  ____________________________________________   PHYSICAL EXAM:  VITAL SIGNS: ED Triage Vitals  Enc Vitals Group     BP 02/16/21 1451 140/80     Pulse Rate 02/16/21 1451 81     Resp 02/16/21 1451 18     Temp 02/16/21 1451 98.3 F (36.8 C)  Temp Source 02/16/21 1451 Oral     SpO2 02/16/21 1451 99 %     Weight 02/16/21 1453 180 lb (81.6 kg)     Height 02/16/21 1453 5\' 8"  (1.727 m)     Head Circumference --      Peak Flow --      Pain Score 02/16/21 1452 6     Pain Loc --      Pain Edu? --      Excl. in [place]? --    Constitutional: Alert and oriented. Well appearing and in no distress. Eyes: Normal exam ENT      Head: Normocephalic and atraumatic.      Mouth/Throat: Mucous membranes are moist. Cardiovascular: Normal rate, regular rhythm. Respiratory: Normal  respiratory effort without tachypnea nor retractions. Breath sounds are clear  Gastrointestinal: Soft moderate right lower quadrant tenderness. Musculoskeletal: Nontender with normal range of motion in all extremities.  Neurologic:  Normal speech and language. No gross focal neurologic deficits  Skin:  Skin is warm, dry and intact.  Psychiatric: Mood and affect are normal.   ____________________________________________    RADIOLOGY  CT consistent with acute uncomplicated appendicitis  ____________________________________________   INITIAL IMPRESSION / ASSESSMENT AND PLAN / ED COURSE  Pertinent labs & imaging results that were available during my care of the patient were reviewed by me and considered in my medical decision making (see chart for details).   Patient presents emergency department for right lower quadrant abdominal pain with a CT scan found to have acute appendicitis.  Patient states moderate right lower quadrant discomfort.  We will check labs obtain a COVID swab IV hydrate treat pain and discussed with surgery.  Patient agreeable to plan of care.  Patient has been seen by surgery they will plan to operate either this evening or tomorrow morning.  Patient agreeable.  Lab work largely [place].  White blood cell count of 9.9.  COVID pending.  Annette Griffin was evaluated in Emergency Department on 02/16/2021 for the symptoms described in the history of present illness. She was evaluated in the context of the global COVID-19 pandemic, which necessitated consideration that the patient might be at risk for infection with the SARS-CoV-2 virus that causes COVID-19. Institutional protocols and algorithms that pertain to the evaluation of patients at risk for COVID-19 are in a state of rapid change based on information released by regulatory bodies including the CDC and federal and state organizations. These policies and algorithms were followed during the patient's care in the  ED.  ____________________________________________   FINAL CLINICAL IMPRESSION(S) / ED DIAGNOSES  Acute appendicitis   Harvest Dark, MD 02/16/21 1623

## 2021-02-17 ENCOUNTER — Observation Stay: Payer: Self-pay | Admitting: Anesthesiology

## 2021-02-17 ENCOUNTER — Encounter
Admission: EM | Disposition: A | Discharge status: Home / Self Care | Payer: Self-pay | Source: Home / Self Care | Attending: Emergency Medicine

## 2021-02-17 DIAGNOSIS — K353 Acute appendicitis with localized peritonitis, without perforation or gangrene: Secondary | ICD-10-CM

## 2021-02-17 HISTORY — PX: LAPAROSCOPIC APPENDECTOMY: SHX408

## 2021-02-17 HISTORY — PX: APPENDECTOMY: SHX54

## 2021-02-17 LAB — BASIC METABOLIC PANEL
Anion gap: 6 (ref 5–15)
BUN: 7 mg/dL (ref 6–20)
CO2: 27 mmol/L (ref 22–32)
Calcium: 8.5 mg/dL — ABNORMAL LOW (ref 8.9–10.3)
Chloride: 108 mmol/L (ref 98–111)
Creatinine, Ser: 0.65 mg/dL (ref 0.44–1.00)
GFR, Estimated: >60 mL/min (ref >60)
Glucose, Bld: 103 mg/dL — ABNORMAL HIGH (ref 70–99)
Potassium: 4.3 mmol/L (ref 3.5–5.1)
Sodium: 141 mmol/L (ref 135–145)

## 2021-02-17 LAB — CBC
HCT: 38.4 % (ref 36.0–46.0)
Hemoglobin: 12.9 g/dL (ref 12.0–15.0)
MCH: 32.4 pg (ref 26.0–34.0)
MCHC: 33.6 g/dL (ref 30.0–36.0)
MCV: 96.5 fL (ref 80.0–100.0)
Platelets: 251 10*3/uL (ref 150–400)
RBC: 3.98 MIL/uL (ref 3.87–5.11)
RDW: 12.4 % (ref 11.5–15.5)
WBC: 7.8 10*3/uL (ref 4.0–10.5)
nRBC: 0 % (ref 0.0–0.2)

## 2021-02-17 SURGERY — APPENDECTOMY, LAPAROSCOPIC
Anesthesia: General

## 2021-02-17 MED ORDER — OXYCODONE HCL 5 MG PO TABS: 5.0000 mg | ORAL_TABLET | Freq: Once | ORAL | Status: DC | PRN

## 2021-02-17 MED ORDER — LIDOCAINE HCL (CARDIAC) PF 100 MG/5ML IV SOSY
PREFILLED_SYRINGE | INTRAVENOUS | Status: DC | PRN
  Administered 2021-02-17: 80 mg via INTRAVENOUS

## 2021-02-17 MED ORDER — FENTANYL CITRATE (PF) 100 MCG/2ML IJ SOLN
INTRAMUSCULAR | Status: AC
  Filled 2021-02-17: qty 2

## 2021-02-17 MED ORDER — LIDOCAINE-EPINEPHRINE 1 %-1:100000 IJ SOLN
INTRAMUSCULAR | Status: DC | PRN
  Administered 2021-02-17: 11 mL via INTRAMUSCULAR

## 2021-02-17 MED ORDER — SUCCINYLCHOLINE CHLORIDE 20 MG/ML IJ SOLN
INTRAMUSCULAR | Status: DC | PRN
  Administered 2021-02-17: 100 mg via INTRAVENOUS

## 2021-02-17 MED ORDER — ONDANSETRON HCL 4 MG/2ML IJ SOLN: 4.0000 mg | Freq: Once | INTRAMUSCULAR | Status: DC | PRN

## 2021-02-17 MED ORDER — SUGAMMADEX SODIUM 500 MG/5ML IV SOLN
INTRAVENOUS | Status: AC
  Filled 2021-02-17: qty 5

## 2021-02-17 MED ORDER — OXYCODONE HCL 5 MG/5ML PO SOLN: 5.0000 mg | Freq: Once | ORAL | Status: DC | PRN

## 2021-02-17 MED ORDER — FENTANYL CITRATE (PF) 100 MCG/2ML IJ SOLN
INTRAMUSCULAR | Status: DC | PRN
  Administered 2021-02-17: 100 ug via INTRAVENOUS

## 2021-02-17 MED ORDER — ONDANSETRON HCL 4 MG/2ML IJ SOLN
INTRAMUSCULAR | Status: DC | PRN
  Administered 2021-02-17: 4 mg via INTRAVENOUS

## 2021-02-17 MED ORDER — MIDAZOLAM HCL 2 MG/2ML IJ SOLN
INTRAMUSCULAR | Status: AC
  Filled 2021-02-17: qty 2

## 2021-02-17 MED ORDER — GLYCOPYRROLATE 0.2 MG/ML IJ SOLN
INTRAMUSCULAR | Status: DC | PRN
  Administered 2021-02-17: .2 mg via INTRAVENOUS

## 2021-02-17 MED ORDER — ONDANSETRON HCL 4 MG/2ML IJ SOLN
INTRAMUSCULAR | Status: AC
  Filled 2021-02-17: qty 2

## 2021-02-17 MED ORDER — MIDAZOLAM HCL 2 MG/2ML IJ SOLN
INTRAMUSCULAR | Status: DC | PRN
  Administered 2021-02-17: 2 mg via INTRAVENOUS

## 2021-02-17 MED ORDER — DEXAMETHASONE SODIUM PHOSPHATE 10 MG/ML IJ SOLN
INTRAMUSCULAR | Status: AC
  Filled 2021-02-17: qty 1

## 2021-02-17 MED ORDER — ACETAMINOPHEN 10 MG/ML IV SOLN
INTRAVENOUS | Status: AC
  Filled 2021-02-17: qty 100

## 2021-02-17 MED ORDER — OXYCODONE HCL 5 MG PO TABS: 5.0000 mg | ORAL_TABLET | Freq: Four times a day (QID) | ORAL | 0 refills | Status: DC | PRN

## 2021-02-17 MED ORDER — PROPOFOL 10 MG/ML IV BOLUS
INTRAVENOUS | Status: DC | PRN
  Administered 2021-02-17: 150 mg via INTRAVENOUS

## 2021-02-17 MED ORDER — ROCURONIUM BROMIDE 10 MG/ML (PF) SYRINGE
PREFILLED_SYRINGE | INTRAVENOUS | Status: AC
  Filled 2021-02-17: qty 10

## 2021-02-17 MED ORDER — ACETAMINOPHEN 10 MG/ML IV SOLN
INTRAVENOUS | Status: DC | PRN
  Administered 2021-02-17: 1000 mg via INTRAVENOUS

## 2021-02-17 MED ORDER — IBUPROFEN 400 MG PO TABS: 600.0000 mg | ORAL_TABLET | Freq: Four times a day (QID) | ORAL | Status: DC | PRN

## 2021-02-17 MED ORDER — LIDOCAINE HCL (PF) 2 % IJ SOLN
INTRAMUSCULAR | Status: AC
  Filled 2021-02-17: qty 5

## 2021-02-17 MED ORDER — SUCCINYLCHOLINE CHLORIDE 200 MG/10ML IV SOSY
PREFILLED_SYRINGE | INTRAVENOUS | Status: AC
  Filled 2021-02-17: qty 10

## 2021-02-17 MED ORDER — DEXMEDETOMIDINE (PRECEDEX) IN NS 20 MCG/5ML (4 MCG/ML) IV SYRINGE
PREFILLED_SYRINGE | INTRAVENOUS | Status: DC | PRN
  Administered 2021-02-17: 8 ug via INTRAVENOUS

## 2021-02-17 MED ORDER — IBUPROFEN 600 MG PO TABS: 600.0000 mg | ORAL_TABLET | Freq: Four times a day (QID) | ORAL | 0 refills | Status: DC | PRN

## 2021-02-17 MED ORDER — SUGAMMADEX SODIUM 500 MG/5ML IV SOLN
INTRAVENOUS | Status: DC | PRN
  Administered 2021-02-17: 326.4 mg via INTRAVENOUS

## 2021-02-17 MED ORDER — ROCURONIUM BROMIDE 100 MG/10ML IV SOLN
INTRAVENOUS | Status: DC | PRN
  Administered 2021-02-17: 10 mg via INTRAVENOUS

## 2021-02-17 MED ORDER — DEXAMETHASONE SODIUM PHOSPHATE 10 MG/ML IJ SOLN
INTRAMUSCULAR | Status: DC | PRN
  Administered 2021-02-17: 10 mg via INTRAVENOUS

## 2021-02-17 MED ORDER — OXYCODONE HCL 5 MG PO TABS: 5.0000 mg | ORAL_TABLET | ORAL | Status: DC | PRN

## 2021-02-17 MED ORDER — FENTANYL CITRATE (PF) 100 MCG/2ML IJ SOLN
25.0000 ug | INTRAMUSCULAR | Status: DC | PRN
  Administered 2021-02-17: 25 ug via INTRAVENOUS

## 2021-02-17 MED ORDER — VENLAFAXINE HCL ER 37.5 MG PO CP24
37.5000 mg | ORAL_CAPSULE | Freq: Every day | ORAL | Status: DC
  Administered 2021-02-17: 37.5 mg via ORAL
  Filled 2021-02-17 (×2): qty 1

## 2021-02-17 SURGICAL SUPPLY — 52 items
APPLICATOR COTTON TIP 6 STRL (MISCELLANEOUS) IMPLANT
APPLICATOR COTTON TIP 6IN STRL (MISCELLANEOUS)
APPLIER CLIP 5 13 M/L LIGAMAX5 (MISCELLANEOUS)
BLADE CLIPPER SURG (BLADE) IMPLANT
BLADE SURG SZ11 CARB STEEL (BLADE) ×2 IMPLANT
CANISTER SUCT 1200ML W/VALVE (MISCELLANEOUS) ×2 IMPLANT
CHLORAPREP W/TINT 26 (MISCELLANEOUS) ×2 IMPLANT
CLIP APPLIE 5 13 M/L LIGAMAX5 (MISCELLANEOUS) IMPLANT
COVER WAND RF STERILE (DRAPES) ×2 IMPLANT
CUTTER FLEX LINEAR 45M (STAPLE) ×2 IMPLANT
DEFOGGER SCOPE WARMER CLEARIFY (MISCELLANEOUS) ×2 IMPLANT
DERMABOND ADVANCED (GAUZE/BANDAGES/DRESSINGS) ×1
DERMABOND ADVANCED .7 DNX12 (GAUZE/BANDAGES/DRESSINGS) ×1 IMPLANT
ELECT CAUTERY BLADE TIP 2.5 (TIP) ×2
ELECT REM PT RETURN 9FT ADLT (ELECTROSURGICAL) ×2
ELECTRODE CAUTERY BLDE TIP 2.5 (TIP) ×1 IMPLANT
ELECTRODE REM PT RTRN 9FT ADLT (ELECTROSURGICAL) ×1 IMPLANT
GLOVE SURG ENC MOIS LTX SZ6.5 (GLOVE) ×2 IMPLANT
GLOVE SURG UNDER LTX SZ7 (GLOVE) ×2 IMPLANT
GOWN STRL REUS W/ TWL LRG LVL3 (GOWN DISPOSABLE) ×3 IMPLANT
GOWN STRL REUS W/TWL LRG LVL3 (GOWN DISPOSABLE) ×3
GRASPER SUT TROCAR 14GX15 (MISCELLANEOUS) ×2 IMPLANT
IRRIGATION STRYKERFLOW (MISCELLANEOUS) ×1 IMPLANT
IRRIGATOR STRYKERFLOW (MISCELLANEOUS) ×2
IV NS 1000ML (IV SOLUTION) ×1
IV NS 1000ML BAXH (IV SOLUTION) ×1 IMPLANT
KIT TURNOVER KIT A (KITS) ×2 IMPLANT
KITTNER LAPARASCOPIC 5X40 (MISCELLANEOUS) ×2 IMPLANT
LABEL OR SOLS (LABEL) ×2 IMPLANT
MANIFOLD NEPTUNE II (INSTRUMENTS) ×2 IMPLANT
NEEDLE HYPO 22GX1.5 SAFETY (NEEDLE) ×2 IMPLANT
NS IRRIG 500ML POUR BTL (IV SOLUTION) ×2 IMPLANT
PACK LAP CHOLECYSTECTOMY (MISCELLANEOUS) ×2 IMPLANT
PENCIL ELECTRO HAND CTR (MISCELLANEOUS) ×2 IMPLANT
POUCH SPECIMEN RETRIEVAL 10MM (ENDOMECHANICALS) ×2 IMPLANT
RELOAD STAPLE TA45 3.5 REG BLU (ENDOMECHANICALS) ×2 IMPLANT
SCISSORS METZENBAUM CVD 33 (INSTRUMENTS) IMPLANT
SET TUBE SMOKE EVAC HIGH FLOW (TUBING) ×2 IMPLANT
SHEARS HARMONIC ACE PLUS 36CM (ENDOMECHANICALS) ×2 IMPLANT
SLEEVE ADV FIXATION 5X100MM (TROCAR) ×2 IMPLANT
STRIP CLOSURE SKIN 1/2X4 (GAUZE/BANDAGES/DRESSINGS) ×2 IMPLANT
SUT MNCRL 4-0 (SUTURE) ×1
SUT MNCRL 4-0 27XMFL (SUTURE) ×1
SUT VIC AB 3-0 SH 27 (SUTURE) ×1
SUT VIC AB 3-0 SH 27X BRD (SUTURE) ×1 IMPLANT
SUT VICRYL 0 AB UR-6 (SUTURE) ×2 IMPLANT
SUTURE MNCRL 4-0 27XMF (SUTURE) ×1 IMPLANT
SYS KII FIOS ACCESS ABD 5X100 (TROCAR) ×2
SYSTEM KII FIOS ACES ABD 5X100 (TROCAR) ×1 IMPLANT
TRAY FOLEY MTR SLVR 16FR STAT (SET/KITS/TRAYS/PACK) ×2 IMPLANT
TROCAR ADV FIXATION 12X100MM (TROCAR) IMPLANT
TROCAR BALLN GELPORT 12X130M (ENDOMECHANICALS) ×2 IMPLANT

## 2021-02-17 NOTE — Progress Notes (Addendum)
[facility] Day(s): 0.   Interval History:  Patient seen and examined No acute events or new complaints overnight.  Patient reports she is overall feeling better, pain improved unless she moves No fever, chills, nausea, emesis She continues to remain without leukocytosis; WBC 7.8K BMP is reassuring; normal renal function, no significant electrolyte derangements She remains NPO Continues on Zosyn Plan for appendectomy today with Dr Celine Ahr  Vital signs in last 24 hours: [min-max] current  Temp:  [97.2 F (36.2 C)-98.4 F (36.9 C)] 98.4 F (36.9 C) (05/05 0408) Pulse Rate:  [70-85] 83 (05/05 0408) Resp:  [16-20] 16 (05/05 0408) BP: (108-140)/(63-85) 116/63 (05/05 0408) SpO2:  [95 %-100 %] 95 % (05/05 0408) Weight:  [81.6 kg-82.3 kg] 81.6 kg (05/04 1453)     Height: 5\' 8"  (172.7 cm) Weight: 81.6 kg BMI (Calculated): 27.38   Intake/Output last 2 shifts:  05/04 0701 - 05/05 0700 In: 850 [I.V.:800; IV Piggyback:50] Out: 600 [Urine:600]   Physical Exam:  Constitutional: alert, cooperative and no distress  Respiratory: breathing non-labored at rest  Cardiovascular: regular rate and sinus rhythm  Gastrointestinal: Soft, RLQ tenderness at McBurney's point, non-distended, no rebound/guarding Integumentary: warm, dry   Labs:  CBC Latest Ref Rng & Units 02/17/2021 02/16/2021 03/12/2020  WBC 4.0 - 10.5 K/uL 7.8 9.9 5.1  Hemoglobin 12.0 - 15.0 g/dL 12.9 14.1 13.8  Hematocrit 36.0 - 46.0 % 38.4 42.5 41.8  Platelets 150 - 400 K/uL 251 278 256   CMP Latest Ref Rng & Units 02/17/2021 02/16/2021 03/12/2020  Glucose 70 - 99 mg/dL 103(H) 81 87  BUN 6 - 20 mg/dL 7 8 9   Creatinine 0.44 - 1.00 mg/dL 0.65 0.58 0.62  Sodium 135 - 145 mmol/L 141 136 141  Potassium 3.5 - 5.1 mmol/L 4.3 3.6 4.0  Chloride 98 - 111 mmol/L 108 102 101  CO2 22 - 32 mmol/L 27 24 22   Calcium 8.9 - 10.3 mg/dL 8.5(L) 9.2 9.7  Total Protein 6.5 - 8.1 g/dL - 7.5 7.0  Total  Bilirubin 0.3 - 1.2 mg/dL - 0.8 0.4  Alkaline Phos 38 - 126 U/L - 54 51  AST 15 - 41 U/L - 20 18  ALT 0 - 44 U/L - 19 16     Imaging studies: No new pertinent imaging studies   Assessment/Plan: (ICD-10's: K35.80) 55 y.o. female with RLQ abdominal pain found to have acute uncomplicated appendicitis.               - Plan for laparoscopic appendectomy with Dr Celine Ahr today, pending OR/Anesthesia availability             - All risks, benefits, and alternatives to above procedure(s) were discussed with the patient and her husband at bedside, all of their questions were answered to their expressed satisfaction, patient expresses she wishes to proceed, and informed consent was obtained.             - NPO + IVF Resuscitation             - IV ABx (Zosyn)                      - Monitor abdominal examination; on-going bowel function             - Pain control prn; antiemetics prn             - Mobilization as tolerated              -  DVT prophylaxis; hold for OR   All of the above findings and recommendations were discussed with the patient and her husband, and all of their questions were answered to their expressed satisfaction.  -- Edison Simon, PA-C [facility] 02/17/2021, 7:17 AM [phone] M-F: 7am - 4pm  I saw and evaluated the patient.  I agree with the above documentation, exam, and plan, which I have edited where appropriate. Fredirick Maudlin  11:56 AM

## 2021-02-17 NOTE — Progress Notes (Signed)
Annette Griffin to be D/C'd home per MD order.  Discussed prescriptions and follow up appointments with the patient. Prescriptions were sent to pts phamacy, medication list explained in detail. Pt verbalized understanding.  Allergies as of 02/17/2021   No Known Allergies     Medication List    TAKE these medications   ferrous sulfate 325 (65 FE) MG EC tablet Take 325 mg by mouth daily.   ibuprofen 600 MG tablet Commonly known as: ADVIL Take 1 tablet (600 mg total) by mouth every 6 (six) hours as needed for fever, mild pain or moderate pain. What changed:   medication strength  how much to take  when to take this  reasons to take this   Mirena (52 MG) 20 MCG/DAY Iud Generic drug: levonorgestrel 1 Intra Uterine Device (1 each total) by Intrauterine route once for 1 dose.   multivitamin tablet Take 1 tablet by mouth daily.   oxyCODONE 5 MG immediate release tablet Commonly known as: Oxy IR/ROXICODONE Take 1 tablet (5 mg total) by mouth every 6 (six) hours as needed for severe pain or breakthrough pain.   venlafaxine XR 37.5 MG 24 hr capsule Commonly known as: Effexor XR Take 1 capsule (37.5 mg total) by mouth daily with breakfast.       Vitals:   02/17/21 1230 02/17/21 1254  BP: (!) 98/57 95/60  Pulse: 69 71  Resp: 13 16  Temp: 98.5 F (36.9 C) (!) 97.5 F (36.4 C)  SpO2: 95% 97%    Skin clean, dry and intact without evidence of skin break down, no evidence of skin tears noted. IV catheter discontinued intact. Site without signs and symptoms of complications. Dressing and pressure applied. Pt denies pain at this time. No complaints noted.  An After Visit Summary was printed and given to the patient. Patient escorted via [name], and D/C home via private auto.  [name]

## 2021-02-17 NOTE — Clinical Social Work Note (Signed)
Patient does not have insurance. Printed GoodRx coupons for Oxycodone and Ibuprofen and put them on front of chart for patient to take with her. Sent secure chat to RN to notify.  [place] Scrape, [place]

## 2021-02-17 NOTE — Progress Notes (Signed)
Mobility Specialist - Progress Note   02/17/21 1500  Mobility  Activity Ambulated in hall;Ambulated to bathroom  Level of Assistance Independent  Assistive Device None  Distance Ambulated (ft) 180 ft  Mobility Response Tolerated well  Mobility performed by Mobility specialist  $Mobility charge 1 Mobility    Pre-mobility: 78 HR, 92% SpO2 Post-mobility: 97% SpO2   Pt ambulated in hallway without AD. No LOB. No dizziness/lightheadedness. Denied pain but does report tenderness in abdomen. Denied SOB on RA. Independent in transfers, including ambulation. Independent in peri-care.    Kathee Delton Mobility Specialist 02/17/21, 3:14 PM

## 2021-02-17 NOTE — Discharge Instructions (Signed)
In addition to included general post-operative instructions,  Diet: Resume home diet.   Activity: No heavy lifting >20 pounds (children, pets, laundry, garbage) for 4 weeks, but light activity and walking are encouraged. Do not drive or drink alcohol if taking narcotic pain medications or having pain that might distract from driving.  Wound care: 2 days after surgery (05/07), you may shower/get incision wet with soapy water and pat dry (do not rub incisions), but no baths or submerging incision underwater until follow-up.   Medications: Resume all home medications. For mild to moderate pain: acetaminophen (Tylenol) or ibuprofen/naproxen (if no kidney disease). Combining Tylenol with alcohol can substantially increase your risk of causing liver disease. Narcotic pain medications, if prescribed, can be used for severe pain, though may cause nausea, constipation, and drowsiness. Do not combine Tylenol and Percocet (or similar) within a 6 hour period as Percocet (and similar) contain(s) Tylenol. If you do not need the narcotic pain medication, you do not need to fill the prescription.  Call office [phone] / [phone]) at any time if any questions, worsening pain, fevers/chills, bleeding, drainage from incision site, or other concerns.

## 2021-02-17 NOTE — Progress Notes (Signed)
Patient status post Laparoscopic Appendectomy.  Abdominal surgical sites x 3 approximated with surgical glue and steristrips, no drainage, ota.  VSS.  No complaint of pain.  Patient A+Ox4 and resting comfortably.  Husband at bedside.  Call bell and needs within reach.  Bed to lowest position and locked.  Will continue to monitor and assess with plan of care.

## 2021-02-17 NOTE — Op Note (Signed)
Operative Note  Laparoscopic Appendectomy   [name] Date of operation:  02/17/2021  Indications: The patient presented with a history of  abdominal pain. Workup has revealed findings consistent with acute appendicitis.  Pre-operative Diagnosis: acute appendicitis  Post-operative Diagnosis: Same  Surgeon: Fredirick Maudlin, MD  Anesthesia: GETA  Findings: inflamed retrocecal appendix without evidence of perforation or abscess   Estimated Blood Loss: <1cc         Specimens: appendix         Complications:  None immediately apparent.  Procedure Details  The patient was seen again in the preop area. The options of surgery versus observation were reviewed with the patient and/or family. The risks of bleeding, infection, recurrence of symptoms, negative laparoscopy, potential for an open procedure, bowel injury, abscess or infection, were all reviewed as well. The patient was taken to Operating Room, identified as Annette Griffin and the procedure verified as laparoscopic appendectomy. A time out was performed and the above information confirmed.  The patient was placed in the supine position and general anesthesia was induced.  Antibiotic prophylaxis was administered and VTE prophylaxis was in place. A Foley catheter was placed by the nursing staff.   The abdomen was prepped and draped in a sterile fashion. A supraumbilical incision was made. A cutdown technique was used to enter the abdominal cavity. Two vicryl stitches were placed on the fascia and a Hasson trocar inserted. Pneumoperitoneum obtained. Two 5 mm ports were placed under direct visualization.  The appendix was identified and found to be acutely inflamed and retrocecal, without evidence of perforation or abscess. The appendix was carefully dissected away from the surrounding tissues. The mesoappendix was divided with the Harmonic scalpel. The base of the appendix was dissected out and divided with a standard load Endo  GIA.The appendix was placed in a Endo Catch bag and removed via the Hasson port. The right lower quadrant and pelvis was then irrigated with normal saline which was then aspirated. The right lower quadrant was inspected there was no sign of bleeding or bowel injury therefore pneumoperitoneum was released, all ports were removed.  The umbilical fascia was closed with 0 Vicryl interrupted sutures and the skin incisions were approximated with subcuticular 4-0 Monocryl. Dermabond was applied, followed by steri strips The patient tolerated the procedure well and there were no immediately apparent complications. The sponge lap and needle count were correct at the end of the procedure.  The patient was taken to the recovery room in stable condition to be admitted for continued care.   Fredirick Maudlin, MD, FACS

## 2021-02-17 NOTE — Transfer of Care (Signed)
Immediate Anesthesia Transfer of Care Note  Patient: Annette Griffin  Procedure(s) Performed: APPENDECTOMY LAPAROSCOPIC (N/A )  Patient Location: PACU  Anesthesia Type:General  Level of Consciousness: awake, drowsy and patient cooperative  Airway & Oxygen Therapy: Patient Spontanous Breathing and Patient connected to face mask oxygen  Post-op Assessment: Report given to RN and Post -op Vital signs reviewed and stable  Post vital signs: Reviewed and stable  Last Vitals:  Vitals Value Taken Time  BP 89/53 02/17/21 1152  Temp    Pulse 79 02/17/21 1154  Resp 7 02/17/21 1154  SpO2 100 % 02/17/21 1154  Vitals shown include unvalidated device data.  Last Pain:  Vitals:   02/17/21 1007  TempSrc: Oral  PainSc: 0-No pain      Patients Stated Pain Goal: 0 (00/92/33 0076)  Complications: No complications documented.

## 2021-02-17 NOTE — Discharge Summary (Addendum)
[facility] SURGICAL DISCHARGE SUMMARY  Patient ID: Annette Griffin MRN: [id number] DOB/AGE: 53-09-1966 55 y.o.  Admit date: 02/16/2021 Discharge date: 02/17/2021  Discharge Diagnoses Patient Active Problem List   Diagnosis Date Noted   Acute appendicitis 02/16/2021    Consultants None  Procedures 02/17/2021:  Laparoscopic Appendectomy   HPI: 55 y.o. female presented to [facility] ED on 05/04 for abdominal pain. Patient reports around a 24 hour of relatively mild and generalized abdominal discomfort starting yesterday which she though may have been indigestion,. However, around 8 pm last night, she noticed the pain became more sharp, severe, and localized into her RLQ. This was made worse with ambulation. Nothing seemed to make it better. She endorses a decreased appetite and nausea with the pain. No fever, chills, cough, CP, SOB, urinary changes, or bowel changes. No history of similar. No previous intra-abdominal surgeries. She initially presented to her PCP this morning for this and underwent CT Abdomen/Pelvis which was concerning for appendicitis without perforation or abscess. She was then referred to the ED for management. Laboratory work up in the ED was reassuring and she is without leukocytosis.   Hospital Course: Informed consent was obtained and documented, and patient underwent uneventful laparoscopic appendectomy (Dr Celine Ahr, 02/17/2021).  Post-operatively, patient's symptoms improved/resolved and advancement of patient's diet and ambulation were well-tolerated. The remainder of patient's hospital course was essentially unremarkable, and discharge planning was initiated accordingly with patient safely able to be discharged home with appropriate discharge instructions, pain control, and outpatient follow-up after all of her questions were answered to her expressed satisfaction.   Discharge Condition: Good    Allergies as of 02/17/2021   No Known Allergies       Medication List     TAKE these medications    ferrous sulfate 325 (65 FE) MG EC tablet Take 325 mg by mouth daily.   ibuprofen 600 MG tablet Commonly known as: ADVIL Take 1 tablet (600 mg total) by mouth every 6 (six) hours as needed for fever, mild pain or moderate pain. What changed:  medication strength how much to take when to take this reasons to take this   Mirena (52 MG) 20 MCG/DAY Iud Generic drug: levonorgestrel 1 Intra Uterine Device (1 each total) by Intrauterine route once for 1 dose.   multivitamin tablet Take 1 tablet by mouth daily.   oxyCODONE 5 MG immediate release tablet Commonly known as: Oxy IR/ROXICODONE Take 1 tablet (5 mg total) by mouth every 6 (six) hours as needed for severe pain or breakthrough pain.   venlafaxine XR 37.5 MG 24 hr capsule Commonly known as: Effexor XR Take 1 capsule (37.5 mg total) by mouth daily with breakfast.          Follow-up Information     Tylene Fantasia, PA-C. Schedule an appointment as soon as possible for a visit in 2 week(s).   Specialty: Physician Assistant Why: s/p lap appy  Contact information: [address] [place]  [id number] [phone]                  Time spent on discharge management including discussion of hospital course, clinical condition, outpatient instructions, prescriptions, and follow up with the patient and members of the medical team: >30 minutes  -- Edison Simon , PA-C [facility]  02/17/2021, 5:41 PM [phone] M-F: 7am - 4pm  I saw and evaluated the patient.  I agree with the above documentation, exam, and plan, which I have edited where appropriate.  Fredirick Maudlin  9:07 AM

## 2021-02-17 NOTE — Anesthesia Preprocedure Evaluation (Signed)
Anesthesia Evaluation  Patient identified by MRN, date of birth, ID band Patient awake    Reviewed: Allergy & Precautions, NPO status , Patient's Chart, lab work & pertinent test results  History of Anesthesia Complications Negative for: history of anesthetic complications  Airway Mallampati: II  TM Distance: >3 FB Neck ROM: Full    Dental no notable dental hx. (+) Teeth Intact   Pulmonary neg pulmonary ROS, neg sleep apnea, neg COPD, Patient abstained from smoking.Not current smoker, former smoker,    Pulmonary exam normal breath sounds clear to auscultation       Cardiovascular Exercise Tolerance: Good METS(-) hypertension(-) CAD and (-) Past MI negative cardio ROS  (-) dysrhythmias  Rhythm:Regular Rate:Normal - Systolic murmurs    Neuro/Psych PSYCHIATRIC DISORDERS Depression negative neurological ROS     GI/Hepatic neg GERD  ,(+)     (-) substance abuse  ,   Endo/Other  neg diabetes  Renal/GU negative Renal ROS     Musculoskeletal   Abdominal   Peds  Hematology  (+) anemia ,   Anesthesia Other Findings Past Medical History: No date: Anemia No date: Depression 06/16/2020: Dysplastic nevus     Comment:  R epigastric - mild  No date: Motion sickness     Comment:  boats  Reproductive/Obstetrics                             Anesthesia Physical Anesthesia Plan  ASA: II  Anesthesia Plan: General   Post-op Pain Management:    Induction: Intravenous  PONV Risk Score and Plan: 4 or greater and Ondansetron, Dexamethasone and Midazolam  Airway Management Planned: Oral ETT  Additional Equipment: None  Intra-op Plan:   Post-operative Plan: Extubation in OR  Informed Consent: I have reviewed the patients History and Physical, chart, labs and discussed the procedure including the risks, benefits and alternatives for the proposed anesthesia with the patient or authorized  representative who has indicated his/her understanding and acceptance.     Dental advisory given  Plan Discussed with: CRNA and Surgeon  Anesthesia Plan Comments: (Discussed risks of anesthesia with patient, including PONV, sore throat, lip/dental damage. Rare risks discussed as well, such as cardiorespiratory and neurological sequelae. Patient understands.)        Anesthesia Quick Evaluation

## 2021-02-17 NOTE — Anesthesia Procedure Notes (Signed)
Procedure Name: Intubation Date/Time: 02/17/2021 11:09 AM Performed by: Jerrye Noble, CRNA Pre-anesthesia Checklist: Patient identified, Emergency Drugs available, Suction available and Patient being monitored Patient Re-evaluated:Patient Re-evaluated prior to induction Oxygen Delivery Method: Circle system utilized Preoxygenation: Pre-oxygenation with 100% oxygen Induction Type: IV induction and Rapid sequence Laryngoscope Size: McGraph and 3 Grade View: Grade I Tube type: Oral Tube size: 7.0 mm Number of attempts: 1 Airway Equipment and Method: Stylet,  Oral airway and Video-laryngoscopy Placement Confirmation: ETT inserted through vocal cords under direct vision,  positive ETCO2 and breath sounds checked- equal and bilateral Secured at: 22 cm Tube secured with: Tape Dental Injury: Teeth and Oropharynx as per pre-operative assessment

## 2021-02-17 NOTE — Progress Notes (Signed)
Patient discharged to home, wheeled out of unit by transport accompanied by family.  Patient A+Ox4.  VSS, Room Air, minimal complaint of pain.  Medications and discharge instructions reviewed with patient by Stacie Glaze, RN.  All questions answered.  PIV x1 removed, no bleeding, and intact.  Patient verbalized understanding of signs and symptoms of infection.  Patient satisfied with overall care at [facility] and will follow up with appointments as listed in AVS.

## 2021-02-17 NOTE — Plan of Care (Signed)
  Problem: Health Behavior/Discharge Planning: Goal: Ability to manage health-related needs will improve Outcome: Progressing   Problem: Clinical Measurements: Goal: Will remain free from infection Outcome: Progressing   Problem: Coping: Goal: Level of anxiety will decrease Outcome: Progressing   Problem: Elimination: Goal: Will not experience complications related to bowel motility Outcome: Progressing Goal: Will not experience complications related to urinary retention Outcome: Progressing   Problem: Pain Managment: Goal: General experience of comfort will improve Outcome: Progressing   Problem: Safety: Goal: Ability to remain free from injury will improve Outcome: Progressing   Problem: Education: Goal: Knowledge of General Education information will improve Description: Including pain rating scale, medication(s)/side effects and non-pharmacologic comfort measures Outcome: Progressing   Problem: Health Behavior/Discharge Planning: Goal: Ability to manage health-related needs will improve Outcome: Progressing   Problem: Clinical Measurements: Goal: Ability to maintain clinical measurements within normal limits will improve Outcome: Progressing Goal: Will remain free from infection Outcome: Progressing Goal: Diagnostic test results will improve Outcome: Progressing Goal: Respiratory complications will improve Outcome: Progressing Goal: Cardiovascular complication will be avoided Outcome: Progressing   Problem: Activity: Goal: Risk for activity intolerance will decrease Outcome: Progressing   Problem: Nutrition: Goal: Adequate nutrition will be maintained Outcome: Progressing   Problem: Coping: Goal: Level of anxiety will decrease Outcome: Progressing   Problem: Elimination: Goal: Will not experience complications related to bowel motility Outcome: Progressing Goal: Will not experience complications related to urinary retention Outcome: Progressing    Problem: Pain Managment: Goal: General experience of comfort will improve Outcome: Progressing   Problem: Safety: Goal: Ability to remain free from injury will improve Outcome: Progressing   Problem: Skin Integrity: Goal: Risk for impaired skin integrity will decrease Outcome: Progressing

## 2021-02-17 NOTE — Anesthesia Postprocedure Evaluation (Signed)
Anesthesia Post Note  Patient: Annette Griffin  Procedure(s) Performed: APPENDECTOMY LAPAROSCOPIC (N/A )  Patient location during evaluation: PACU Anesthesia Type: General Level of consciousness: awake and alert Pain management: pain level controlled Vital Signs Assessment: post-procedure vital signs reviewed and stable Respiratory status: spontaneous breathing, nonlabored ventilation, respiratory function stable and patient connected to nasal cannula oxygen Cardiovascular status: blood pressure returned to baseline and stable Postop Assessment: no apparent nausea or vomiting Anesthetic complications: no   No complications documented.   Last Vitals:  Vitals:   02/17/21 1230 02/17/21 1254  BP: (!) 98/57 95/60  Pulse: 69 71  Resp: 13 16  Temp: 36.9 C (!) 36.4 C  SpO2: 95% 97%    Last Pain:  Vitals:   02/17/21 1254  TempSrc: Oral  PainSc: 2                  Arita Miss

## 2021-02-18 ENCOUNTER — Encounter: Payer: Self-pay | Admitting: General Surgery

## 2021-02-18 LAB — SURGICAL PATHOLOGY

## 2021-03-03 NOTE — Progress Notes (Signed)
BP 126/81   Pulse 69   Wt 173 lb (78.5 kg)   SpO2 97%   BMI 26.30 kg/m    Subjective:    Patient ID: Annette Griffin, female    DOB: [birth year], 55 y.o.   MRN: [id number]  HPI: BERLINDA Griffin is a 55 y.o. female  Chief Complaint  Patient presents with  . Anxiety  . Depression   DEPRESSION Patient has been on Effexor for 6 weeks.  Reports symptoms have improved since being on the medication. Patient denies any questions at visit. Mood status: better Satisfied with current treatment?: yes Symptom severity: moderate  Duration of current treatment : 2 weeks Side effects: no  Medication compliance: Excellent Psychotherapy/counseling: no in the past Previous psychiatric medications: prozac Depressed mood: yes Anxious mood: yes Anhedonia: no Significant weight loss or gain: no Insomnia: Patient finds that she is not resting well and tired during the day.  hard to stay asleep Fatigue: yes Feelings of worthlessness or guilt: yes Impaired concentration/indecisiveness: no Suicidal ideations: no Hopelessness: improved Crying spells: improved Depression screen [facility] 2/9 03/04/2021 01/03/2021 12/17/2020 03/12/2020  Decreased Interest 0 0 3 1  Down, Depressed, Hopeless 0 0 3 0  PHQ - 2 Score 0 0 6 1  Altered sleeping 3 0 3 3  Tired, decreased energy 3 3 3 3   Change in appetite 0 3 3 3   Feeling bad or failure about yourself  0 0 3 1  Trouble concentrating 0 0 3 0  Moving slowly or fidgety/restless 0 0 1 0  Suicidal thoughts 0 0 2 0  PHQ-9 Score 6 6 24 11   Difficult doing work/chores Not difficult at all Not difficult at all Somewhat difficult -   GAD 7 : Generalized Anxiety Score 03/04/2021 01/03/2021 12/17/2020 03/12/2020  Nervous, Anxious, on Edge 0 3 3 2   Control/stop worrying 0 0 1 2  Worry too much - different things 0 0 1 2  Trouble relaxing 0 0 3 2  Restless 0 0 1 0  Easily annoyed or irritable 0 0 1 1  Afraid - awful might happen 0 0 0 0  Total GAD 7 Score 0 3 10 9    Anxiety Difficulty Not difficult at all Not difficult at all Extremely difficult Not difficult at all    Denies HA, CP, SOB, dizziness, palpitations, visual changes, and lower extremity swelling.   Relevant past medical, surgical, family and social history reviewed and updated as indicated. Interim medical history since our last visit reviewed. Allergies and medications reviewed and updated.  Review of Systems  Eyes: Negative for visual disturbance.  Respiratory: Negative for cough, chest tightness and shortness of breath.   Cardiovascular: Negative for chest pain, palpitations and leg swelling.  Neurological: Negative for dizziness and headaches.  Psychiatric/Behavioral: Positive for dysphoric mood. Negative for decreased concentration, sleep disturbance and suicidal ideas. The patient is nervous/anxious.     Per HPI unless specifically indicated above     Objective:    BP 126/81   Pulse 69   Wt 173 lb (78.5 kg)   SpO2 97%   BMI 26.30 kg/m   Wt Readings from Last 3 Encounters:  03/04/21 173 lb (78.5 kg)  02/17/21 180 lb (81.6 kg)  02/16/21 181 lb 8 oz (82.3 kg)    Physical Exam Vitals and nursing note reviewed.  Constitutional:      General: She is not in acute distress.    Appearance: Normal appearance. She is normal weight. She is  not ill-appearing, toxic-appearing or diaphoretic.  HENT:     Head: Normocephalic.     Right Ear: External ear normal.     Left Ear: External ear normal.     Nose: Nose normal.     Mouth/Throat:     Mouth: Mucous membranes are moist.     Pharynx: Oropharynx is clear.  Eyes:     General:        Right eye: No discharge.        Left eye: No discharge.     Extraocular Movements: Extraocular movements intact.     Conjunctiva/sclera: Conjunctivae normal.     Pupils: Pupils are equal, round, and reactive to light.  Cardiovascular:     Rate and Rhythm: Normal rate and regular rhythm.     Heart sounds: No murmur heard.   Pulmonary:      Effort: Pulmonary effort is normal. No respiratory distress.     Breath sounds: Normal breath sounds. No wheezing or rales.  Musculoskeletal:     Cervical back: Normal range of motion and neck supple.  Skin:    General: Skin is warm and dry.     Capillary Refill: Capillary refill takes less than 2 seconds.  Neurological:     General: No focal deficit present.     Mental Status: She is alert and oriented to person, place, and time. Mental status is at baseline.  Psychiatric:        Mood and Affect: Mood normal.        Behavior: Behavior normal.        Thought Content: Thought content normal.        Judgment: Judgment normal.      Assessment & Plan:   Problem List Items Addressed This Visit      Other   Depression, major, single episode, moderate ([place]) - Primary    Chronic.  Well controlled. Continue with current medication regimen.  Denies SI.  Return in 6 months for physical and fasting labs.  Return sooner if symptoms arise.          Follow up plan: Return in about 6 months (around 09/04/2021) for Physical and Fasting labs, Depression/Anxiety FU.   A total of 20 minutes were spent on this encounter today.  When total time is documented, this includes both the face-to-face and non-face-to-face time personally spent before, during and after the visit on the date of the encounter.

## 2021-03-04 ENCOUNTER — Ambulatory Visit: Payer: PRIVATE HEALTH INSURANCE | Admitting: Nurse Practitioner

## 2021-03-04 ENCOUNTER — Other Ambulatory Visit: Payer: Self-pay

## 2021-03-04 ENCOUNTER — Ambulatory Visit (INDEPENDENT_AMBULATORY_CARE_PROVIDER_SITE_OTHER): Payer: Self-pay | Admitting: Nurse Practitioner

## 2021-03-04 ENCOUNTER — Encounter: Payer: Self-pay | Admitting: Nurse Practitioner

## 2021-03-04 VITALS — BP 126/81 | HR 69 | Wt 173.0 lb

## 2021-03-04 DIAGNOSIS — F321 Major depressive disorder, single episode, moderate: Secondary | ICD-10-CM

## 2021-03-04 NOTE — Assessment & Plan Note (Signed)
Chronic.  Well controlled. Continue with current medication regimen.  Denies SI.  Return in 6 months for physical and fasting labs.  Return sooner if symptoms arise.

## 2021-03-18 ENCOUNTER — Encounter: Payer: PRIVATE HEALTH INSURANCE | Admitting: Family Medicine

## 2021-03-18 ENCOUNTER — Encounter: Payer: PRIVATE HEALTH INSURANCE | Admitting: Nurse Practitioner

## 2021-05-30 NOTE — Telephone Encounter (Signed)
Appt virtual OK

## 2021-06-01 ENCOUNTER — Telehealth (INDEPENDENT_AMBULATORY_CARE_PROVIDER_SITE_OTHER): Payer: Self-pay | Admitting: Family Medicine

## 2021-06-01 DIAGNOSIS — F321 Major depressive disorder, single episode, moderate: Secondary | ICD-10-CM

## 2021-06-01 MED ORDER — VENLAFAXINE HCL ER 75 MG PO CP24: ORAL_CAPSULE | ORAL | 2 refills | Status: DC

## 2021-06-01 NOTE — Progress Notes (Signed)
There were no vitals taken for this visit.   Subjective:    Patient ID: Annette Griffin, female    DOB: May 13, 1966, 55 y.o.   MRN: [id number]  HPI: Annette Griffin is a 56 y.o. female  Chief Complaint  Patient presents with   Depression    Patient wants to discuss increasing her Venlafaxine   DEPRESSION Mood status: exacerbated Satisfied with current treatment?: no Symptom severity: moderate  Duration of current treatment : months Side effects: no Medication compliance: excellent compliance Psychotherapy/counseling: no  Previous psychiatric medications: effexor Depressed mood: yes Anxious mood: no Anhedonia: no Significant weight loss or gain: no Insomnia: no  Fatigue: yes Feelings of worthlessness or guilt: yes Impaired concentration/indecisiveness: yes Suicidal ideations: no Hopelessness: no Crying spells: no Depression screen [facility] 2/9 06/01/2021 03/04/2021 01/03/2021 12/17/2020 03/12/2020  Decreased Interest 3 0 0 3 1  Down, Depressed, Hopeless 2 0 0 3 0  PHQ - 2 Score 5 0 0 6 1  Altered sleeping 3 3 0 3 3  Tired, decreased energy '3 3 3 3 3  '$ Change in appetite 3 0 '3 3 3  '$ Feeling bad or failure about yourself  0 0 0 3 1  Trouble concentrating 3 0 0 3 0  Moving slowly or fidgety/restless 0 0 0 1 0  Suicidal thoughts 0 0 0 2 0  PHQ-9 Score '17 6 6 24 11  '$ Difficult doing work/chores Not difficult at all Not difficult at all Not difficult at all Somewhat difficult -     Relevant past medical, surgical, family and social history reviewed and updated as indicated. Interim medical history since our last visit reviewed. Allergies and medications reviewed and updated.  Review of Systems  Constitutional: Negative.   Respiratory: Negative.    Cardiovascular: Negative.   Gastrointestinal: Negative.   Musculoskeletal: Negative.   Neurological: Negative.   Psychiatric/Behavioral:  Positive for dysphoric mood. Negative for agitation, behavioral problems, confusion, decreased  concentration, hallucinations, self-injury, sleep disturbance and suicidal ideas. The patient is not nervous/anxious and is not hyperactive.    Per HPI unless specifically indicated above     Objective:    There were no vitals taken for this visit.  Wt Readings from Last 3 Encounters:  03/04/21 173 lb (78.5 kg)  02/17/21 180 lb (81.6 kg)  02/16/21 181 lb 8 oz (82.3 kg)    Physical Exam Vitals and nursing note reviewed.  Constitutional:      General: She is not in acute distress.    Appearance: Normal appearance. She is not ill-appearing, toxic-appearing or diaphoretic.  HENT:     Head: Normocephalic and atraumatic.     Right Ear: External ear normal.     Left Ear: External ear normal.     Nose: Nose normal.     Mouth/Throat:     Mouth: Mucous membranes are moist.     Pharynx: Oropharynx is clear.  Eyes:     General: No scleral icterus.       Right eye: No discharge.        Left eye: No discharge.     Conjunctiva/sclera: Conjunctivae normal.     Pupils: Pupils are equal, round, and reactive to light.  Pulmonary:     Effort: Pulmonary effort is normal. No respiratory distress.     Comments: Speaking in full sentences Musculoskeletal:        General: Normal range of motion.     Cervical back: Normal range of motion.  Skin:  Coloration: Skin is not jaundiced or pale.     Findings: No bruising, erythema, lesion or rash.  Neurological:     Mental Status: She is alert and oriented to person, place, and time. Mental status is at baseline.  Psychiatric:        Mood and Affect: Mood normal.        Behavior: Behavior normal.        Thought Content: Thought content normal.        Judgment: Judgment normal.    Results for orders placed or performed during the hospital encounter of 02/16/21  Resp Panel by RT-PCR (Flu A&B, Covid)   Specimen: Nasopharyngeal(NP) swabs in vial transport medium  Result Value Ref Range   SARS Coronavirus 2 by RT PCR NEGATIVE NEGATIVE   Influenza  A by PCR NEGATIVE NEGATIVE   Influenza B by PCR NEGATIVE NEGATIVE  Lipase, blood  Result Value Ref Range   Lipase 31 11 - 51 U/L  Comprehensive metabolic panel  Result Value Ref Range   Sodium 136 135 - 145 mmol/L   Potassium 3.6 3.5 - 5.1 mmol/L   Chloride 102 98 - 111 mmol/L   CO2 24 22 - 32 mmol/L   Glucose, Bld 81 70 - 99 mg/dL   BUN 8 6 - 20 mg/dL   Creatinine, Ser 0.58 0.44 - 1.00 mg/dL   Calcium 9.2 8.9 - 10.3 mg/dL   Total Protein 7.5 6.5 - 8.1 g/dL   Albumin 4.6 3.5 - 5.0 g/dL   AST 20 15 - 41 U/L   ALT 19 0 - 44 U/L   Alkaline Phosphatase 54 38 - 126 U/L   Total Bilirubin 0.8 0.3 - 1.2 mg/dL   GFR, Estimated >60 >60 mL/min   Anion gap 10 5 - 15  CBC  Result Value Ref Range   WBC 9.9 4.0 - 10.5 K/uL   RBC 4.39 3.87 - 5.11 MIL/uL   Hemoglobin 14.1 12.0 - 15.0 g/dL   HCT 42.5 36.0 - 46.0 %   MCV 96.8 80.0 - 100.0 fL   MCH 32.1 26.0 - 34.0 pg   MCHC 33.2 30.0 - 36.0 g/dL   RDW 12.1 11.5 - 15.5 %   Platelets 278 150 - 400 K/uL   nRBC 0.0 0.0 - 0.2 %  Urinalysis, Complete w Microscopic  Result Value Ref Range   Color, Urine COLORLESS (A) YELLOW   APPearance CLEAR (A) CLEAR   Specific Gravity, Urine 1.002 (L) 1.005 - 1.030   pH 7.0 5.0 - 8.0   Glucose, UA NEGATIVE NEGATIVE mg/dL   Hgb urine dipstick SMALL (A) NEGATIVE   Bilirubin Urine NEGATIVE NEGATIVE   Ketones, ur 5 (A) NEGATIVE mg/dL   Protein, ur NEGATIVE NEGATIVE mg/dL   Nitrite NEGATIVE NEGATIVE   Leukocytes,Ua NEGATIVE NEGATIVE   WBC, UA 0-5 0 - 5 WBC/hpf   Bacteria, UA RARE (A) NONE SEEN   Squamous Epithelial / LPF 0-5 0 - 5  CBC  Result Value Ref Range   WBC 7.8 4.0 - 10.5 K/uL   RBC 3.98 3.87 - 5.11 MIL/uL   Hemoglobin 12.9 12.0 - 15.0 g/dL   HCT 38.4 36.0 - 46.0 %   MCV 96.5 80.0 - 100.0 fL   MCH 32.4 26.0 - 34.0 pg   MCHC 33.6 30.0 - 36.0 g/dL   RDW 12.4 11.5 - 15.5 %   Platelets 251 150 - 400 K/uL   nRBC 0.0 0.0 - 0.2 %  Basic metabolic panel  Result Value Ref Range   Sodium 141 135 -  145 mmol/L   Potassium 4.3 3.5 - 5.1 mmol/L   Chloride 108 98 - 111 mmol/L   CO2 27 22 - 32 mmol/L   Glucose, Bld 103 (H) 70 - 99 mg/dL   BUN 7 6 - 20 mg/dL   Creatinine, Ser 0.65 0.44 - 1.00 mg/dL   Calcium 8.5 (L) 8.9 - 10.3 mg/dL   GFR, Estimated >60 >60 mL/min   Anion gap 6 5 - 15  POC urine preg, ED  Result Value Ref Range   Preg Test, Ur NEGATIVE NEGATIVE  Surgical pathology  Result Value Ref Range   SURGICAL PATHOLOGY      SURGICAL PATHOLOGY CASE: [id number] PATIENT: Rosalie Doctor Surgical Pathology Report     Specimen Submitted: A. Appendix  Clinical History: Acute appendicitis      DIAGNOSIS: A. APPENDIX; APPENDECTOMY: - ACUTE TRANSMURAL APPENDICITIS WITH SEROSITIS. - NEGATIVE FOR MALIGNANCY.   GROSS DESCRIPTION: A. Labeled: Appendix Received: Formalin Collection time: 11:27 AM on 02/17/2021 Placed into formalin time: 11:32 AM on 02/17/2021 Size: 4 cm long by 1.1 cm in diameter with an attached portion of mesoappendix, 4.5 x 1.9 x 1.5 cm External surface: The serosa is pink and smooth with overlying scattered areas of tan exudate. Perforation: None grossly appreciated. Fecalith: None grossly appreciated. Description: The proximal margin is inked blue.  The lumen contains tan-red purulent and hemorrhagic fecal material.  The mucosa is tan with scattered areas of pale green to brown discoloration and diffuse areas of flattening.  The wall thickness is up to 0.2 cm.   Block summary: 1 - one half bisected appendiceal tip and representative cross sections 2 - one half bisected resection margin and representative cross-section  RB 02/17/2021   Final Diagnosis performed by Betsy Pries, MD.   Electronically signed 02/18/2021 9:58:13AM The electronic signature indicates that the named Attending Pathologist has evaluated the specimen Technical component performed at [facility], [address] [place], [place] [address] Lab: [id number] Dir: Rush Farmer,  MD, MMM  Professional component performed at [facility], [facility], [place], [place], [place] [address] Lab: [phone] Dir: Dellia Nims. Reuel Derby, MD       Assessment & Plan:   Problem List Items Addressed This Visit       Other   Depression, major, single episode, moderate ([place])    In exacerbation. Will increase her to '75mg'$  effexor with the option to go up to 150. Follow up with PCP in about 6 weeks. Call with any concerns.       Relevant Medications   venlafaxine XR (EFFEXOR XR) 75 MG 24 hr capsule     Follow up plan: Return in about 6 weeks (around 07/13/2021).    This visit was completed via video visit through MyChart due to the restrictions of the COVID-19 pandemic. All issues as above were discussed and addressed. Physical exam was done as above through visual confirmation on video through MyChart. If it was felt that the patient should be evaluated in the office, they were directed there. The patient verbally consented to this visit. Location of the patient: home Location of the provider: work Those involved with this call:  Provider: Park Liter, DO CMA: Yvonna Alanis, [place] Desk/Registration: Barth Kirks  Time spent on call:  15 minutes with patient face to face via video conference. More than 50% of this time was spent in counseling and coordination of care. 23 minutes total spent in review  of patient's record and preparation of their chart.

## 2021-06-01 NOTE — Assessment & Plan Note (Signed)
In exacerbation. Will increase her to '75mg'$  effexor with the option to go up to 150. Follow up with PCP in about 6 weeks. Call with any concerns.

## 2021-06-05 IMAGING — MG DIGITAL SCREENING BILAT W/ TOMO W/ CAD
8 series · 8 of 24 positions shown · non-contrast
Comparison: None.

CLINICAL DATA: Screening.

EXAM:
DIGITAL SCREENING BILATERAL MAMMOGRAM WITH TOMO AND CAD

[R CC synth-2D]
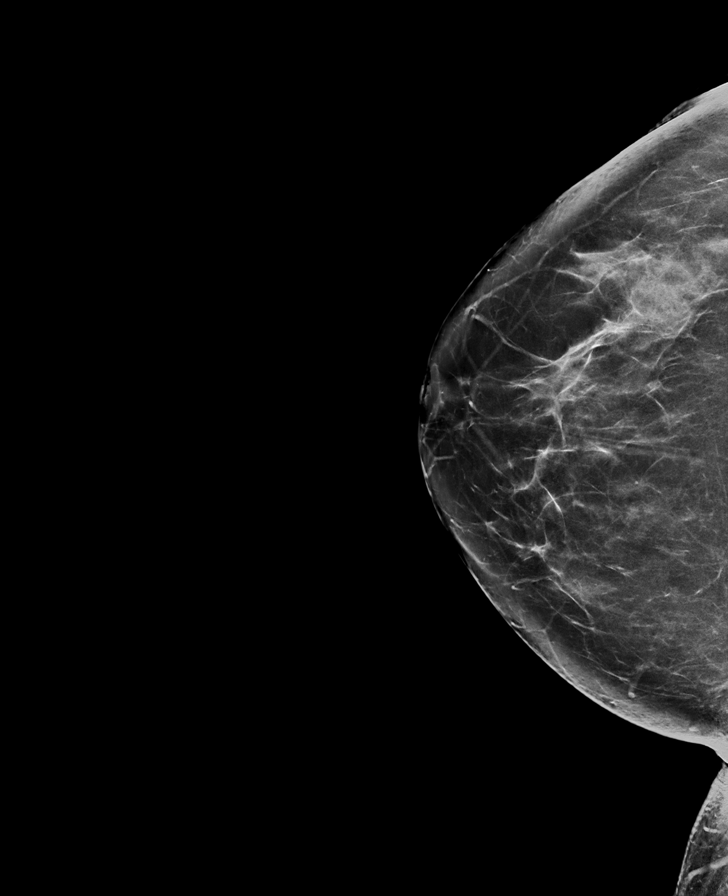

[L CC synth-2D]
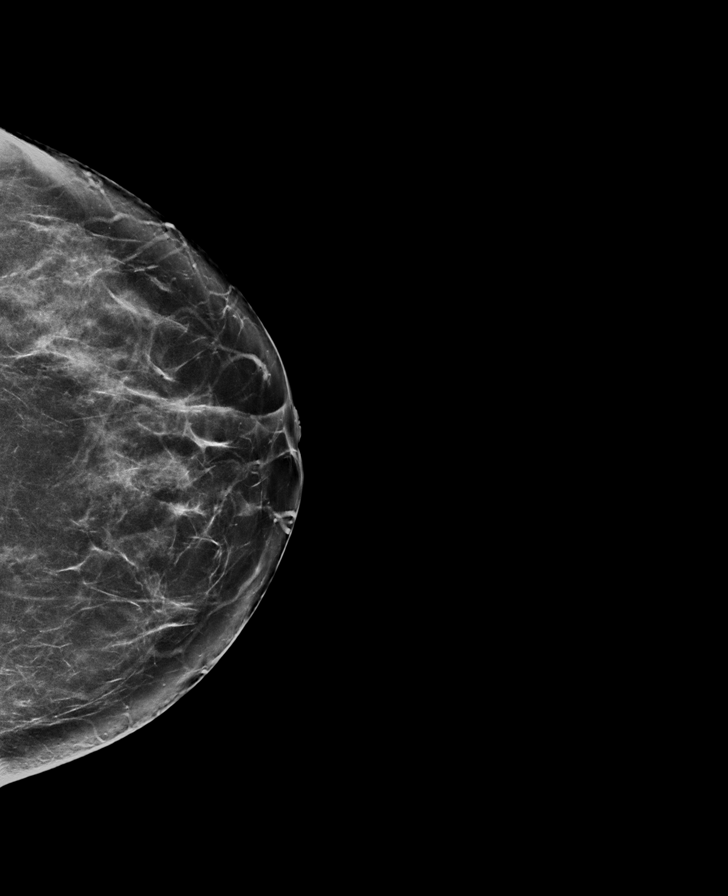

[L MLO synth-2D]
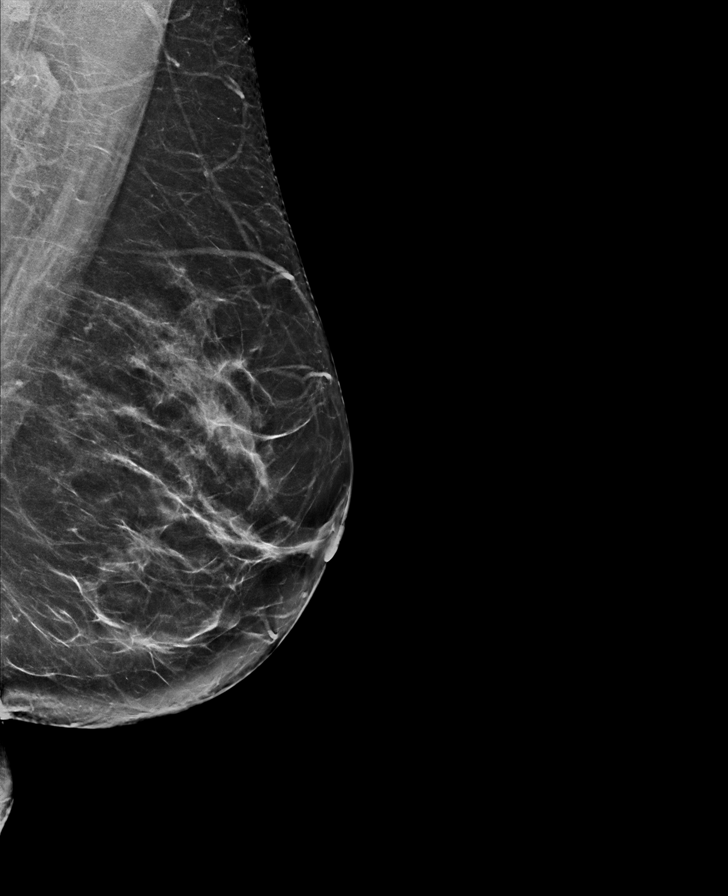

[R MLO synth-2D]
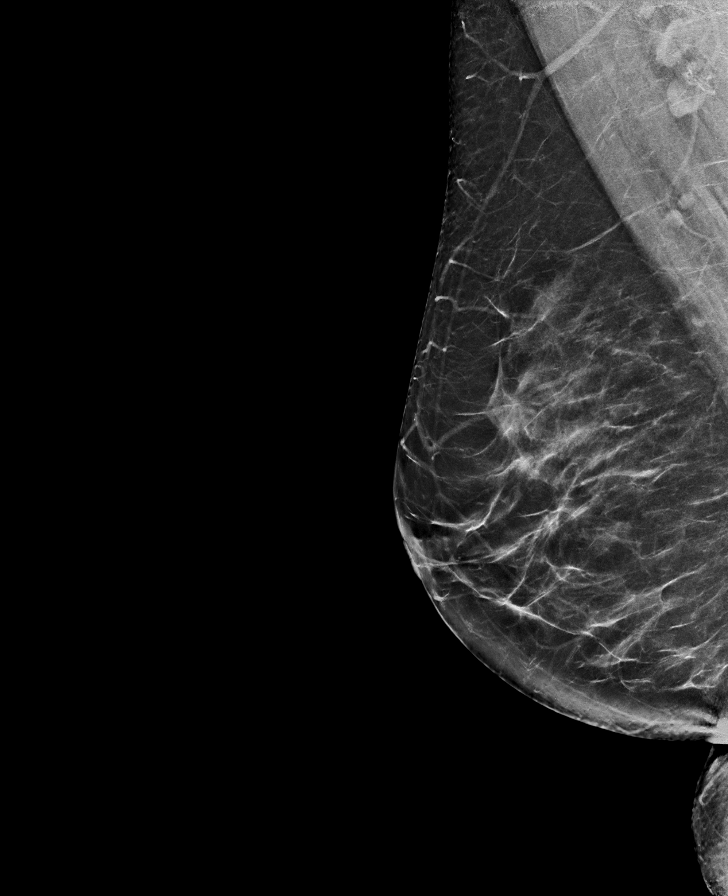

[R CC tomo · tomo slice 43/84.0]
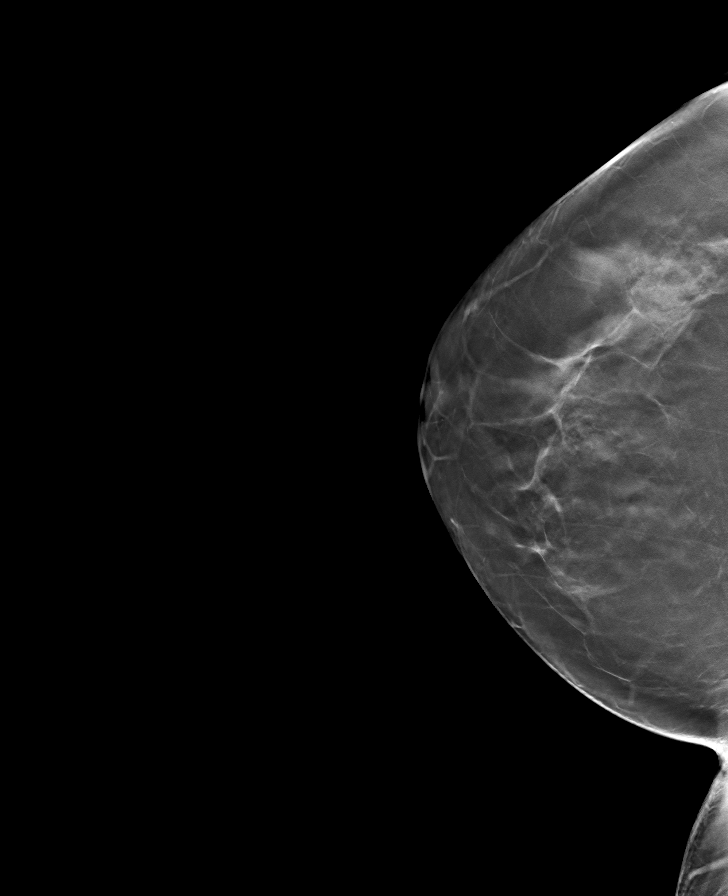

[L MLO tomo · tomo slice 39/78.0]
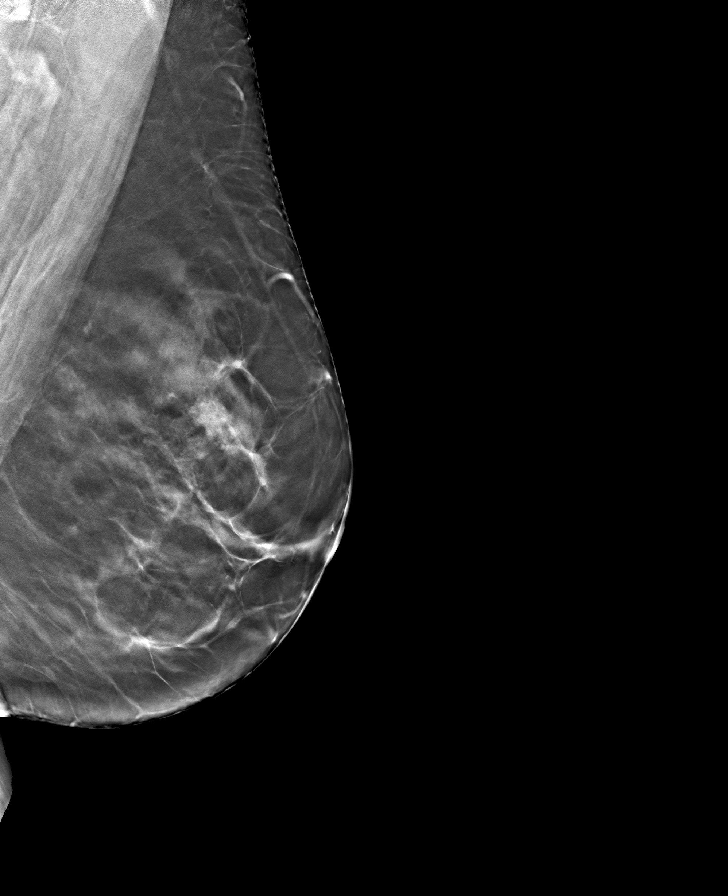

[R MLO tomo · tomo slice 39/77.0]
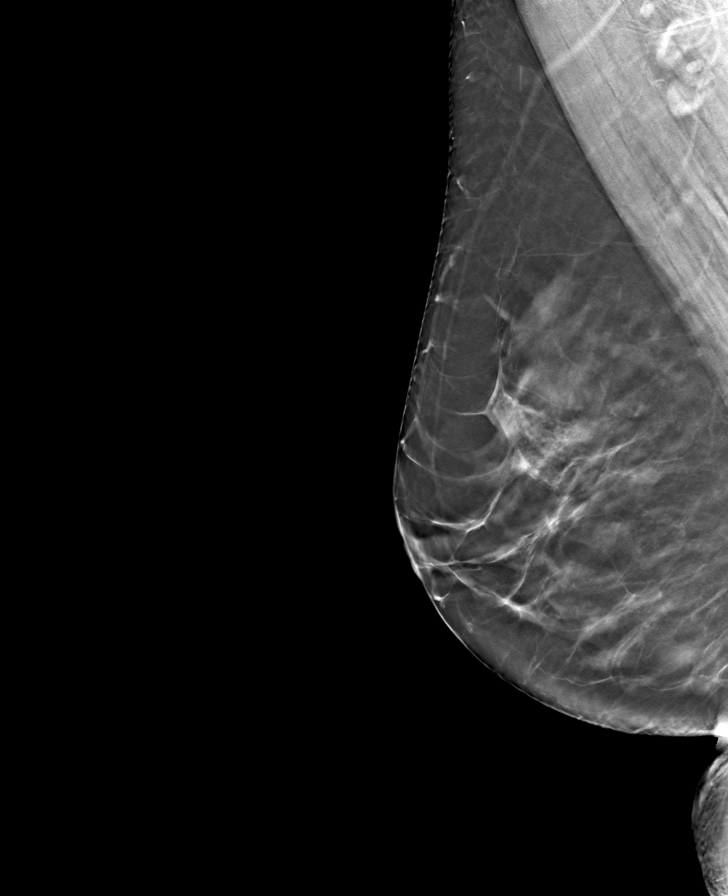

[L CC tomo · tomo slice 41/80.0]
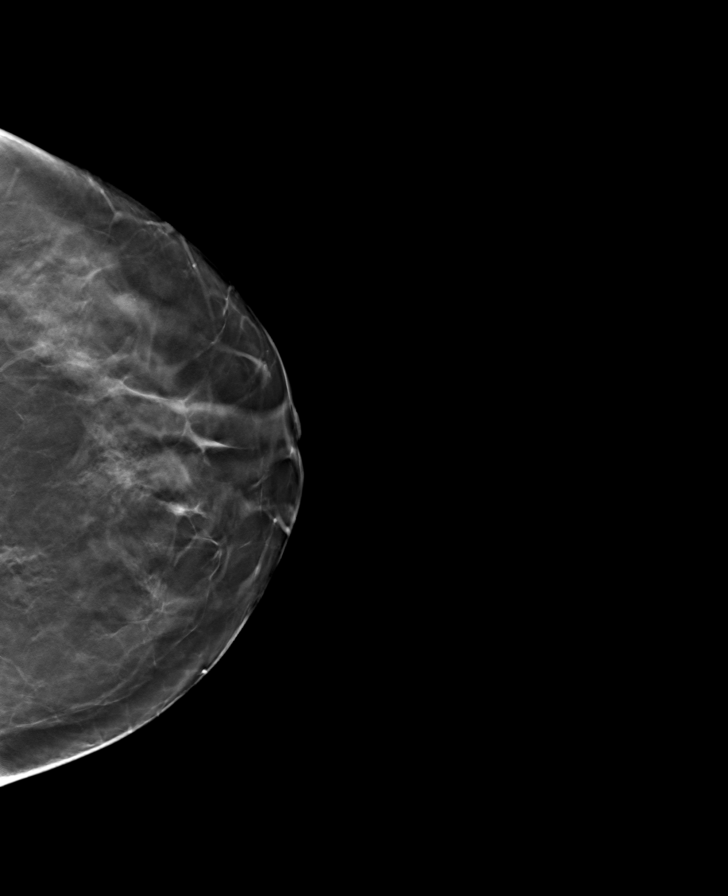

[8 of 24 positions shown; findings below may reference images not displayed]

ACR Breast Density Category b: There are scattered areas of
fibroglandular density.
FINDINGS: There are no findings suspicious for malignancy. Images were
processed with CAD.
IMPRESSION: No mammographic evidence of malignancy. A result letter of this
screening mammogram will be mailed directly to the patient.

RECOMMENDATION:
Screening mammogram in one year. (Code:Y5-G-EJ6)

BI-RADS CATEGORY  1: Negative.

## 2021-06-30 ENCOUNTER — Other Ambulatory Visit: Payer: Self-pay | Admitting: Family Medicine

## 2021-06-30 DIAGNOSIS — F321 Major depressive disorder, single episode, moderate: Secondary | ICD-10-CM

## 2021-06-30 NOTE — Telephone Encounter (Signed)
Requested medication (s) are due for refill today: last refill 06/01/21  Requested medication (s) are on the active medication list: yes  Last refill:  06/01/21 #60 2 refills  Future visit scheduled: yes in 2 months   Notes to clinic:  taper dose. Last labs 03/12/20     Requested Prescriptions  Pending Prescriptions Disp Refills   venlafaxine XR (EFFEXOR-XR) 75 MG 24 hr capsule [Pharmacy Med Name: VENLAFAXINE HCL ER 75 MG CAP] 180 capsule 1    Sig: Take 1 tab daily for 3-4 week, if still not feeling right, increase to 2 pills daily     Psychiatry: Antidepressants - SNRI - desvenlafaxine & venlafaxine Failed - 06/30/2021 11:33 AM      Failed - LDL in normal range and within 360 days    LDL Chol Calc (NIH)  Date Value Ref Range Status  03/12/2020 102 (H) 0 - 99 mg/dL Final          Failed - Total Cholesterol in normal range and within 360 days    Cholesterol, Total  Date Value Ref Range Status  03/12/2020 194 100 - 199 mg/dL Final          Failed - Triglycerides in normal range and within 360 days    Triglycerides  Date Value Ref Range Status  03/12/2020 130 0 - 149 mg/dL Final          Passed - Completed PHQ-2 or PHQ-9 in the last 360 days      Passed - Last BP in normal range    BP Readings from Last 1 Encounters:  03/04/21 126/81          Passed - Valid encounter within last 6 months    Recent Outpatient Visits           4 weeks ago Depression, major, single episode, moderate ([place])   [place], Megan P, DO   3 months ago Depression, major, single episode, moderate ([place])   [place], Santiago Glad, NP   4 months ago Right lower quadrant abdominal pain   [place], NP   5 months ago Depression, major, single episode, moderate ([place])   [place], Santiago Glad, NP   6 months ago Depression, major, single episode, moderate ([place])   [place] Jon Billings,  NP       Future Appointments             In 2 months Jon Billings, NP [facility], [place]

## 2021-07-25 ENCOUNTER — Encounter: Payer: Self-pay | Admitting: General Surgery

## 2021-08-23 ENCOUNTER — Encounter: Payer: Self-pay | Admitting: Family Medicine

## 2021-08-23 DIAGNOSIS — F321 Major depressive disorder, single episode, moderate: Secondary | ICD-10-CM

## 2021-08-24 MED ORDER — VENLAFAXINE HCL ER 150 MG PO CP24: ORAL_CAPSULE | ORAL | 1 refills | Status: DC

## 2021-08-31 ENCOUNTER — Ambulatory Visit: Payer: Self-pay

## 2021-09-05 ENCOUNTER — Ambulatory Visit: Payer: Self-pay | Admitting: Nurse Practitioner

## 2022-01-12 ENCOUNTER — Ambulatory Visit (INDEPENDENT_AMBULATORY_CARE_PROVIDER_SITE_OTHER): Payer: Self-pay | Admitting: Nurse Practitioner

## 2022-01-12 ENCOUNTER — Encounter: Payer: Self-pay | Admitting: Nurse Practitioner

## 2022-01-12 VITALS — BP 139/84 | HR 76 | Temp 97.6°F | Wt 178.4 lb

## 2022-01-12 DIAGNOSIS — L309 Dermatitis, unspecified: Secondary | ICD-10-CM

## 2022-01-12 DIAGNOSIS — D509 Iron deficiency anemia, unspecified: Secondary | ICD-10-CM

## 2022-01-12 DIAGNOSIS — F321 Major depressive disorder, single episode, moderate: Secondary | ICD-10-CM

## 2022-01-12 DIAGNOSIS — E559 Vitamin D deficiency, unspecified: Secondary | ICD-10-CM

## 2022-01-12 DIAGNOSIS — R5383 Other fatigue: Secondary | ICD-10-CM

## 2022-01-12 MED ORDER — VENLAFAXINE HCL ER 150 MG PO CP24: ORAL_CAPSULE | ORAL | 1 refills | Status: DC

## 2022-01-12 MED ORDER — VENLAFAXINE HCL ER 75 MG PO CP24: 75.0000 mg | ORAL_CAPSULE | Freq: Every day | ORAL | 1 refills | Status: DC

## 2022-01-12 MED ORDER — TRIAMCINOLONE ACETONIDE 0.1 % EX CREA: 1.0000 "application " | TOPICAL_CREAM | Freq: Two times a day (BID) | CUTANEOUS | 0 refills | Status: AC

## 2022-01-12 NOTE — Assessment & Plan Note (Signed)
Chronic. Not well controlled.  Will increase dose to Effexor '225mg'$ .  If symptoms do not improve, will consider changing medication to Celexa.  Labs ordered today.  Medications refilled.  Follow up in 1 month for reevaluation. ?

## 2022-01-12 NOTE — Progress Notes (Signed)
? ?BP 139/84   Pulse 76   Temp 97.6 ?F (36.4 ?C) (Oral)   Wt 178 lb 6.4 oz (80.9 kg)   SpO2 99%   BMI 27.13 kg/m?   ? ?Subjective:  ? ? Patient ID: Annette Griffin, female    DOB: [birth year], 56 y.o.   MRN: [id number] ? ?HPI: ?LORENZO PEREYRA is a 56 y.o. female ? ?Chief Complaint  ?Patient presents with  ? Depression  ? ?DEPRESSION ?Mood status: exacerbated ?Satisfied with current treatment?: no ?Symptom severity: moderate  ?Duration of current treatment : months ?Side effects: no ?Medication compliance: excellent compliance ?Psychotherapy/counseling: no  ?Previous psychiatric medications: effexor ?Depressed mood: yes ?Anxious mood: no ?Anhedonia: no ?Significant weight loss or gain: no ?Insomnia: no  ?Fatigue: yes ?Feelings of worthlessness or guilt: yes ?Impaired concentration/indecisiveness: yes ?Suicidal ideations: no ?Hopelessness: no ?Crying spells: no ? ?  01/12/2022  ?  2:52 PM 06/01/2021  ?  8:35 AM 03/04/2021  ?  8:48 AM 01/03/2021  ?  2:06 PM 12/17/2020  ?  4:03 PM  ?Depression screen PHQ 2/9  ?Decreased Interest 3 3 0 0 3  ?Down, Depressed, Hopeless 1 2 0 0 3  ?PHQ - 2 Score 4 5 0 0 6  ?Altered sleeping '3 3 3 '$ 0 3  ?Tired, decreased energy '3 3 3 3 3  '$ ?Change in appetite 0 3 0 3 3  ?Feeling bad or failure about yourself  0 0 0 0 3  ?Trouble concentrating 2 3 0 0 3  ?Moving slowly or fidgety/restless 1 0 0 0 1  ?Suicidal thoughts 0 0 0 0 2  ?PHQ-9 Score '13 17 6 6 24  '$ ?Difficult doing work/chores Somewhat difficult Not difficult at all Not difficult at all Not difficult at all Somewhat difficult  ? ? ? ?Relevant past medical, surgical, family and social history reviewed and updated as indicated. Interim medical history since our last visit reviewed. ?Allergies and medications reviewed and updated. ? ?Review of Systems  ?Constitutional:  Positive for fatigue.  ?Respiratory: Negative.    ?Cardiovascular: Negative.   ?Gastrointestinal: Negative.   ?Musculoskeletal: Negative.   ?Neurological: Negative.    ?Psychiatric/Behavioral:  Positive for dysphoric mood. Negative for agitation, behavioral problems, confusion, decreased concentration, hallucinations, self-injury, sleep disturbance and suicidal ideas. The patient is not nervous/anxious and is not hyperactive.   ? ?Per HPI unless specifically indicated above ? ?   ?Objective:  ?  ?BP 139/84   Pulse 76   Temp 97.6 ?F (36.4 ?C) (Oral)   Wt 178 lb 6.4 oz (80.9 kg)   SpO2 99%   BMI 27.13 kg/m?   ?Wt Readings from Last 3 Encounters:  ?01/12/22 178 lb 6.4 oz (80.9 kg)  ?03/04/21 173 lb (78.5 kg)  ?02/17/21 180 lb (81.6 kg)  ?  ?Physical Exam ?Vitals and nursing note reviewed.  ?Constitutional:   ?   General: She is not in acute distress. ?   Appearance: Normal appearance. She is not ill-appearing, toxic-appearing or diaphoretic.  ?HENT:  ?   Head: Normocephalic and atraumatic.  ?   Right Ear: External ear normal.  ?   Left Ear: External ear normal.  ?   Nose: Nose normal.  ?   Mouth/Throat:  ?   Mouth: Mucous membranes are moist.  ?   Pharynx: Oropharynx is clear.  ?Eyes:  ?   General: No scleral icterus.    ?   Right eye: No discharge.     ?   Left eye:  No discharge.  ?   Conjunctiva/sclera: Conjunctivae normal.  ?   Pupils: Pupils are equal, round, and reactive to light.  ?Cardiovascular:  ?   Rate and Rhythm: Normal rate and regular rhythm.  ?   Heart sounds: No murmur heard. ?  No gallop.  ?Pulmonary:  ?   Effort: Pulmonary effort is normal. No respiratory distress.  ?   Comments: Speaking in full sentences ?Musculoskeletal:     ?   General: Normal range of motion.  ?   Cervical back: Normal range of motion.  ?Skin: ?   Coloration: Skin is not jaundiced or pale.  ?   Findings: No bruising, erythema, lesion or rash.  ?Neurological:  ?   Mental Status: She is alert and oriented to person, place, and time. Mental status is at baseline.  ?Psychiatric:     ?   Mood and Affect: Mood normal.     ?   Behavior: Behavior normal.     ?   Thought Content: Thought content  normal.     ?   Judgment: Judgment normal.  ? ? ?Results for orders placed or performed during the hospital encounter of 02/16/21  ?Resp Panel by RT-PCR (Flu A&B, Covid)  ? Specimen: Nasopharyngeal(NP) swabs in vial transport medium  ?Result Value Ref Range  ? SARS Coronavirus 2 by RT PCR NEGATIVE NEGATIVE  ? Influenza A by PCR NEGATIVE NEGATIVE  ? Influenza B by PCR NEGATIVE NEGATIVE  ?Lipase, blood  ?Result Value Ref Range  ? Lipase 31 11 - 51 U/L  ?Comprehensive metabolic panel  ?Result Value Ref Range  ? Sodium 136 135 - 145 mmol/L  ? Potassium 3.6 3.5 - 5.1 mmol/L  ? Chloride 102 98 - 111 mmol/L  ? CO2 24 22 - 32 mmol/L  ? Glucose, Bld 81 70 - 99 mg/dL  ? BUN 8 6 - 20 mg/dL  ? Creatinine, Ser 0.58 0.44 - 1.00 mg/dL  ? Calcium 9.2 8.9 - 10.3 mg/dL  ? Total Protein 7.5 6.5 - 8.1 g/dL  ? Albumin 4.6 3.5 - 5.0 g/dL  ? AST 20 15 - 41 U/L  ? ALT 19 0 - 44 U/L  ? Alkaline Phosphatase 54 38 - 126 U/L  ? Total Bilirubin 0.8 0.3 - 1.2 mg/dL  ? GFR, Estimated >60 >60 mL/min  ? Anion gap 10 5 - 15  ?CBC  ?Result Value Ref Range  ? WBC 9.9 4.0 - 10.5 K/uL  ? RBC 4.39 3.87 - 5.11 MIL/uL  ? Hemoglobin 14.1 12.0 - 15.0 g/dL  ? HCT 42.5 36.0 - 46.0 %  ? MCV 96.8 80.0 - 100.0 fL  ? MCH 32.1 26.0 - 34.0 pg  ? MCHC 33.2 30.0 - 36.0 g/dL  ? RDW 12.1 11.5 - 15.5 %  ? Platelets 278 150 - 400 K/uL  ? nRBC 0.0 0.0 - 0.2 %  ?Urinalysis, Complete w Microscopic  ?Result Value Ref Range  ? Color, Urine COLORLESS (A) YELLOW  ? APPearance CLEAR (A) CLEAR  ? Specific Gravity, Urine 1.002 (L) 1.005 - 1.030  ? pH 7.0 5.0 - 8.0  ? Glucose, UA NEGATIVE NEGATIVE mg/dL  ? Hgb urine dipstick SMALL (A) NEGATIVE  ? Bilirubin Urine NEGATIVE NEGATIVE  ? Ketones, ur 5 (A) NEGATIVE mg/dL  ? Protein, ur NEGATIVE NEGATIVE mg/dL  ? Nitrite NEGATIVE NEGATIVE  ? Leukocytes,Ua NEGATIVE NEGATIVE  ? WBC, UA 0-5 0 - 5 WBC/hpf  ? Bacteria, UA RARE (A) NONE SEEN  ?  Squamous Epithelial / LPF 0-5 0 - 5  ?CBC  ?Result Value Ref Range  ? WBC 7.8 4.0 - 10.5 K/uL  ? RBC  3.98 3.87 - 5.11 MIL/uL  ? Hemoglobin 12.9 12.0 - 15.0 g/dL  ? HCT 38.4 36.0 - 46.0 %  ? MCV 96.5 80.0 - 100.0 fL  ? MCH 32.4 26.0 - 34.0 pg  ? MCHC 33.6 30.0 - 36.0 g/dL  ? RDW 12.4 11.5 - 15.5 %  ? Platelets 251 150 - 400 K/uL  ? nRBC 0.0 0.0 - 0.2 %  ?Basic metabolic panel  ?Result Value Ref Range  ? Sodium 141 135 - 145 mmol/L  ? Potassium 4.3 3.5 - 5.1 mmol/L  ? Chloride 108 98 - 111 mmol/L  ? CO2 27 22 - 32 mmol/L  ? Glucose, Bld 103 (H) 70 - 99 mg/dL  ? BUN 7 6 - 20 mg/dL  ? Creatinine, Ser 0.65 0.44 - 1.00 mg/dL  ? Calcium 8.5 (L) 8.9 - 10.3 mg/dL  ? GFR, Estimated >60 >60 mL/min  ? Anion gap 6 5 - 15  ?POC urine preg, ED  ?Result Value Ref Range  ? Preg Test, Ur NEGATIVE NEGATIVE  ?Surgical pathology  ?Result Value Ref Range  ? SURGICAL PATHOLOGY    ?  SURGICAL PATHOLOGY ?CASE: [id number] ?PATIENT: Rosalie Doctor ?Surgical Pathology Report ? ? ? ? ?Specimen Submitted: ?A. Appendix ? ?Clinical History: Acute appendicitis ? ? ? ? ? ?DIAGNOSIS: ?A. APPENDIX; APPENDECTOMY: ?- ACUTE TRANSMURAL APPENDICITIS WITH SEROSITIS. ?- NEGATIVE FOR MALIGNANCY. ? ? ?GROSS DESCRIPTION: ?A. Labeled: Appendix ?Received: Formalin ?Collection time: 11:27 AM on 02/17/2021 ?Placed into formalin time: 11:32 AM on 02/17/2021 ?Size: 4 cm long by 1.1 cm in diameter with an attached portion of ?mesoappendix, 4.5 x 1.9 x 1.5 cm ?External surface: The serosa is pink and smooth with overlying scattered ?areas of tan exudate. ?Perforation: None grossly appreciated. ?Fecalith: None grossly appreciated. ?Description: The proximal margin is inked blue.  The lumen contains ?tan-red purulent and hemorrhagic fecal material.  The mucosa is tan with ?scattered areas of pale green to brown discoloration and diffuse areas ?of flattening.  The wall thickness is up to 0.2 cm. ? ? Block summary: ?1 - one half bisected appendiceal tip and representative cross sections ?2 - one half bisected resection margin and representative cross-section ? ?RB  02/17/2021 ? ? ?Final Diagnosis performed by Betsy Pries, MD.   Electronically signed ?02/18/2021 9:58:13AM ?The electronic signature indicates that the named Attending Pathologist ?has evaluated the specimen ?Technical component p

## 2022-01-12 NOTE — Assessment & Plan Note (Signed)
Chronic. Will check labs at visit today. Continue with iron supplement.  Will make recommendations based on lab results.  ?

## 2022-01-13 LAB — CBC WITH DIFFERENTIAL/PLATELET
Basophils Absolute: 0 10*3/uL (ref 0.0–0.2)
Basos: 1 %
EOS (ABSOLUTE): 0.1 10*3/uL (ref 0.0–0.4)
Eos: 1 %
Hematocrit: 41.6 % (ref 34.0–46.6)
Hemoglobin: 14.4 g/dL (ref 11.1–15.9)
Immature Grans (Abs): 0 10*3/uL (ref 0.0–0.1)
Immature Granulocytes: 0 %
Lymphocytes Absolute: 1.3 10*3/uL (ref 0.7–3.1)
Lymphs: 25 %
MCH: 34 pg — ABNORMAL HIGH (ref 26.6–33.0)
MCHC: 34.6 g/dL (ref 31.5–35.7)
MCV: 98 fL — ABNORMAL HIGH (ref 79–97)
Monocytes Absolute: 0.4 10*3/uL (ref 0.1–0.9)
Monocytes: 7 %
Neutrophils Absolute: 3.4 10*3/uL (ref 1.4–7.0)
Neutrophils: 66 %
Platelets: 258 10*3/uL (ref 150–450)
RBC: 4.23 x10E6/uL (ref 3.77–5.28)
RDW: 12.4 % (ref 11.7–15.4)
WBC: 5.2 10*3/uL (ref 3.4–10.8)

## 2022-01-13 LAB — COMPREHENSIVE METABOLIC PANEL
ALT: 20 IU/L (ref 0–32)
AST: 18 IU/L (ref 0–40)
Albumin/Globulin Ratio: 1.9 (ref 1.2–2.2)
Albumin: 4.7 g/dL (ref 3.8–4.9)
Alkaline Phosphatase: 58 IU/L (ref 44–121)
BUN/Creatinine Ratio: 17 (ref 9–23)
BUN: 10 mg/dL (ref 6–24)
Bilirubin Total: 0.4 mg/dL (ref 0.0–1.2)
CO2: 24 mmol/L (ref 20–29)
Calcium: 9.4 mg/dL (ref 8.7–10.2)
Chloride: 102 mmol/L (ref 96–106)
Creatinine, Ser: 0.58 mg/dL (ref 0.57–1.00)
Globulin, Total: 2.5 g/dL (ref 1.5–4.5)
Glucose: 88 mg/dL (ref 70–99)
Potassium: 4.1 mmol/L (ref 3.5–5.2)
Sodium: 142 mmol/L (ref 134–144)
Total Protein: 7.2 g/dL (ref 6.0–8.5)
eGFR: 107 mL/min/{1.73_m2} (ref >59)

## 2022-01-13 LAB — VITAMIN D 25 HYDROXY (VIT D DEFICIENCY, FRACTURES): Vit D, 25-Hydroxy: 40.6 ng/mL (ref 30.0–100.0)

## 2022-01-13 NOTE — Progress Notes (Signed)
HI Annette Griffin.  Your lab work looks good  Your vitamin D is within normal limits.  Continue with the plan as discussed during the visit.  We will follow up in 1 month to see how you are doing.

## 2022-02-16 ENCOUNTER — Encounter: Payer: Self-pay | Admitting: Nurse Practitioner

## 2022-02-16 ENCOUNTER — Ambulatory Visit (INDEPENDENT_AMBULATORY_CARE_PROVIDER_SITE_OTHER): Payer: Self-pay | Admitting: Nurse Practitioner

## 2022-02-16 VITALS — BP 137/88 | HR 84 | Temp 98.4°F | Wt 177.4 lb

## 2022-02-16 DIAGNOSIS — F321 Major depressive disorder, single episode, moderate: Secondary | ICD-10-CM

## 2022-02-16 DIAGNOSIS — Z23 Encounter for immunization: Secondary | ICD-10-CM

## 2022-02-16 MED ORDER — CITALOPRAM HYDROBROMIDE 10 MG PO TABS: 10.0000 mg | ORAL_TABLET | Freq: Every day | ORAL | 0 refills | Status: DC

## 2022-02-16 NOTE — Patient Instructions (Signed)
Effexor '150mg'$  for three days ?Then Effexor '75mg'$  for three days ?Then start the Celexa. ?After two weeks if tolerating well can increase to 2 tabs of Celexa. ?

## 2022-02-16 NOTE — Assessment & Plan Note (Signed)
Chronic. Not well controlled. Will taper off Effexor.  Instructions given to patient.  Will start Celexa '10mg'$  daily.  Can increase to two tabs if tolerating well after two weeks.  Follow up in 6 weeks for reevaluation.  Call sooner if concerns arise.  ?

## 2022-02-16 NOTE — Progress Notes (Signed)
? ?BP 137/88   Pulse 84   Temp 98.4 ?F (36.9 ?C) (Oral)   Wt 177 lb 6.4 oz (80.5 kg)   SpO2 98%   BMI 26.97 kg/m?   ? ?Subjective:  ? ? Patient ID: Annette Griffin, female    DOB: May 08, 1966, 56 y.o.   MRN: [id number] ? ?HPI: ?Annette Griffin is a 56 y.o. female ? ?Chief Complaint  ?Patient presents with  ? Depression  ? Anxiety  ? ?DEPRESSION ?Patient states she still feels very tired.  States she feels like after she started the medication last year she felt more relief and now she does not feel like it is as effective as it used to be. ? ?Mood status: exacerbated ?Satisfied with current treatment?: no ?Symptom severity: moderate  ?Duration of current treatment : months ?Side effects: no ?Medication compliance: excellent compliance ?Psychotherapy/counseling: no  ?Previous psychiatric medications: effexor ?Depressed mood: yes ?Anxious mood: no ?Anhedonia: no ?Significant weight loss or gain: no ?Insomnia: no  ?Fatigue: yes ?Feelings of worthlessness or guilt: yes ?Impaired concentration/indecisiveness: yes ?Suicidal ideations: no ?Hopelessness: no ?Crying spells: no ? ?  02/16/2022  ?  3:38 PM 01/12/2022  ?  2:52 PM 06/01/2021  ?  8:35 AM 03/04/2021  ?  8:48 AM 01/03/2021  ?  2:06 PM  ?Depression screen PHQ 2/9  ?Decreased Interest _0 0 0  ?Down, Depressed, Hopeless 0 1 2 0 0  ?PHQ - 2 Score _1 0 0  ?Altered sleeping _2 0  ?Tired, decreased energy _3 ?Change in appetite 2 0 3 0 3  ?Feeling bad or failure about yourself  1 0 0 0 0  ?Trouble concentrating _4 0 0  ?Moving slowly or fidgety/restless 0 1 0 0 0  ?Suicidal thoughts 0 0 0 0 0  ?PHQ-9 Score _5 ?Difficult doing work/chores Not difficult at all Somewhat difficult Not difficult at all Not difficult at all Not difficult at all  ? ? ?  02/16/2022  ?  3:38 PM 01/12/2022  ?  2:52 PM 03/04/2021  ?  8:50 AM 01/03/2021  ?  2:08 PM  ?GAD 7 : Generalized Anxiety Score  ?Nervous, Anxious, on Edge 2 3 0 3  ?Control/stop worrying 0 0 0 0  ?Worry  too much - different things 0 3 0 0  ?Trouble relaxing 0 0 0 0  ?Restless 0 2 0 0  ?Easily annoyed or irritable 0 0 0 0  ?Afraid - awful might happen 0 0 0 0  ?Total GAD 7 Score 2 8 0 3  ?Anxiety Difficulty Not difficult at all Somewhat difficult Not difficult at all Not difficult at all  ? ? ? ? ?Relevant past medical, surgical, family and social history reviewed and updated as indicated. Interim medical history since our last visit reviewed. ?Allergies and medications reviewed and updated. ? ?Review of Systems  ?Constitutional:  Positive for fatigue.  ?Respiratory: Negative.    ?Cardiovascular: Negative.   ?Gastrointestinal: Negative.   ?Musculoskeletal: Negative.   ?Neurological: Negative.   ?Psychiatric/Behavioral:  Positive for dysphoric mood. Negative for agitation, behavioral problems, confusion, decreased concentration, hallucinations, self-injury, sleep disturbance and suicidal ideas. The patient is not nervous/anxious and is not hyperactive.   ? ?Per HPI unless specifically indicated above ? ?   ?Objective:  ?  ?BP 137/88   Pulse 84   Temp 98.4 ?F (36.9 ?C) (Oral)  Wt 177 lb 6.4 oz (80.5 kg)   SpO2 98%   BMI 26.97 kg/m?   ?Wt Readings from Last 3 Encounters:  ?02/16/22 177 lb 6.4 oz (80.5 kg)  ?01/12/22 178 lb 6.4 oz (80.9 kg)  ?03/04/21 173 lb (78.5 kg)  ?  ?Physical Exam ?Vitals and nursing note reviewed.  ?Constitutional:   ?   General: She is not in acute distress. ?   Appearance: Normal appearance. She is not ill-appearing, toxic-appearing or diaphoretic.  ?HENT:  ?   Head: Normocephalic and atraumatic.  ?   Right Ear: External ear normal.  ?   Left Ear: External ear normal.  ?   Nose: Nose normal.  ?   Mouth/Throat:  ?   Mouth: Mucous membranes are moist.  ?   Pharynx: Oropharynx is clear.  ?Eyes:  ?   General: No scleral icterus.    ?   Right eye: No discharge.     ?   Left eye: No discharge.  ?   Conjunctiva/sclera: Conjunctivae normal.  ?   Pupils: Pupils are equal, round, and reactive to  light.  ?Cardiovascular:  ?   Rate and Rhythm: Normal rate and regular rhythm.  ?   Heart sounds: No murmur heard. ?  No gallop.  ?Pulmonary:  ?   Effort: Pulmonary effort is normal. No respiratory distress.  ?   Comments: Speaking in full sentences ?Musculoskeletal:     ?   General: Normal range of motion.  ?   Cervical back: Normal range of motion.  ?Skin: ?   Coloration: Skin is not jaundiced or pale.  ?   Findings: No bruising, erythema, lesion or rash.  ?Neurological:  ?   Mental Status: She is alert and oriented to person, place, and time. Mental status is at baseline.  ?Psychiatric:     ?   Mood and Affect: Mood normal.     ?   Behavior: Behavior normal.     ?   Thought Content: Thought content normal.     ?   Judgment: Judgment normal.  ? ? ?Results for orders placed or performed in visit on 01/12/22  ?Comp Met (CMET)  ?Result Value Ref Range  ? Glucose 88 70 - 99 mg/dL  ? BUN 10 6 - 24 mg/dL  ? Creatinine, Ser 0.58 0.57 - 1.00 mg/dL  ? eGFR 107 >59 mL/min/1.73  ? BUN/Creatinine Ratio 17 9 - 23  ? Sodium 142 134 - 144 mmol/L  ? Potassium 4.1 3.5 - 5.2 mmol/L  ? Chloride 102 96 - 106 mmol/L  ? CO2 24 20 - 29 mmol/L  ? Calcium 9.4 8.7 - 10.2 mg/dL  ? Total Protein 7.2 6.0 - 8.5 g/dL  ? Albumin 4.7 3.8 - 4.9 g/dL  ? Globulin, Total 2.5 1.5 - 4.5 g/dL  ? Albumin/Globulin Ratio 1.9 1.2 - 2.2  ? Bilirubin Total 0.4 0.0 - 1.2 mg/dL  ? Alkaline Phosphatase 58 44 - 121 IU/L  ? AST 18 0 - 40 IU/L  ? ALT 20 0 - 32 IU/L  ?CBC w/Diff  ?Result Value Ref Range  ? WBC 5.2 3.4 - 10.8 x10E3/uL  ? RBC 4.23 3.77 - 5.28 x10E6/uL  ? Hemoglobin 14.4 11.1 - 15.9 g/dL  ? Hematocrit 41.6 34.0 - 46.6 %  ? MCV 98 (H) 79 - 97 fL  ? MCH 34.0 (H) 26.6 - 33.0 pg  ? MCHC 34.6 31.5 - 35.7 g/dL  ? RDW 12.4 11.7 - 15.4 %  ?  Platelets 258 150 - 450 x10E3/uL  ? Neutrophils 66 Not Estab. %  ? Lymphs 25 Not Estab. %  ? Monocytes 7 Not Estab. %  ? Eos 1 Not Estab. %  ? Basos 1 Not Estab. %  ? Neutrophils Absolute 3.4 1.4 - 7.0 x10E3/uL  ?  Lymphocytes Absolute 1.3 0.7 - 3.1 x10E3/uL  ? Monocytes Absolute 0.4 0.1 - 0.9 x10E3/uL  ? EOS (ABSOLUTE) 0.1 0.0 - 0.4 x10E3/uL  ? Basophils Absolute 0.0 0.0 - 0.2 x10E3/uL  ? Immature Granulocytes 0 Not Estab. %  ? Immature Grans (Abs) 0.0 0.0 - 0.1 x10E3/uL  ?Vitamin D (25 hydroxy)  ?Result Value Ref Range  ? Vit D, 25-Hydroxy 40.6 30.0 - 100.0 ng/mL  ? ?   ?Assessment & Plan:  ? ?Problem List Items Addressed This Visit   ? ?  ? Other  ? Depression, major, single episode, moderate ([place]) - Primary  ?  Chronic. Not well controlled. Will taper off Effexor.  Instructions given to patient.  Will start Celexa 60m daily.  Can increase to two tabs if tolerating well after two weeks.  Follow up in 6 weeks for reevaluation.  Call sooner if concerns arise.  ? ?  ?  ? Relevant Medications  ? citalopram (CELEXA) 10 MG tablet  ? ?Other Visit Diagnoses   ? ? Need for shingles vaccine      ? Relevant Orders  ? Varicella-zoster vaccine IM (Shingrix)  ? ?  ?  ? ?Follow up plan: ?Return in about 6 weeks (around 03/30/2022). ? ? ? ? ? ? ?

## 2022-03-30 ENCOUNTER — Ambulatory Visit: Payer: Self-pay | Admitting: Nurse Practitioner

## 2022-04-10 ENCOUNTER — Other Ambulatory Visit: Payer: Self-pay | Admitting: Nurse Practitioner

## 2022-04-10 ENCOUNTER — Encounter: Payer: Self-pay | Admitting: *Deleted

## 2022-08-21 ENCOUNTER — Ambulatory Visit (INDEPENDENT_AMBULATORY_CARE_PROVIDER_SITE_OTHER): Payer: Self-pay | Admitting: Physician Assistant

## 2022-08-21 ENCOUNTER — Encounter: Payer: Self-pay | Admitting: Physician Assistant

## 2022-08-21 VITALS — BP 118/81 | HR 71 | Temp 98.6°F | Ht 67.99 in | Wt 189.8 lb

## 2022-08-21 DIAGNOSIS — R21 Rash and other nonspecific skin eruption: Secondary | ICD-10-CM

## 2022-08-21 MED ORDER — VALACYCLOVIR HCL 1 G PO TABS: 1000.0000 mg | ORAL_TABLET | Freq: Two times a day (BID) | ORAL | 0 refills | Status: AC

## 2022-08-21 NOTE — Progress Notes (Signed)
Acute Office Visit   Patient: Annette Griffin   DOB: Mar 23, 1966   56 y.o. Female  MRN: [id number] Visit Date: 08/21/2022  Today's healthcare provider: Dani Gobble [place], PA-C  Introduced myself to the patient as a Journalist, newspaper and provided education on APPs in clinical practice.    Chief Complaint  Patient presents with   Rash    At Corner of lips on right side. Patient states that she has had since the beginning of summer   Subjective    Rash   HPI     Rash    Additional comments: At Corner of lips on right side. Patient states that she has had since the beginning of summer      Last edited by Jerelene Redden, CMA on 08/21/2022  3:27 PM.      Reports she has had a rash on her face since summer - like around March and April  She reports she has been using triamcinolone cream which does seem to improve it but not resolve it  Onset: sudden Duration: since  Location: Right side of mouth and seems to be spreading under eyes  Reports it started to spread to eye area over the summer  Reports it itches and in the morning appears more crusty and drains  Reports there is no full resolution - rash keeps recurring and lingering She denies pain but reports it is itchy Alleviating: nothing  Aggravating: nothing  Interventions: triamcinolone cream  She denies previous history of cold sores    Medications: Outpatient Medications Prior to Visit  Medication Sig   Cholecalciferol (VITAMIN D3) 125 MCG (5000 UT) CAPS Take 1 capsule by mouth daily.   ferrous sulfate 325 (65 FE) MG EC tablet Take 325 mg by mouth daily.   Multiple Vitamin (MULTIVITAMIN) tablet Take 1 tablet by mouth daily.   Omega 3-6-9 Fatty Acids (TRIPLE OMEGA-3-6-9) CAPS Take 1 capsule by mouth daily.   triamcinolone cream (KENALOG) 0.1 % Apply 1 application. topically 2 (two) times daily.   UNABLE TO FIND Take 3 tablets by mouth daily. Neuro-Mag   UNABLE TO FIND Take 2 tablets by mouth daily. Amberen   vitamin C  (ASCORBIC ACID) 500 MG tablet Take 500 mg by mouth daily.   citalopram (CELEXA) 10 MG tablet Take 1 tablet (10 mg total) by mouth daily. (Patient not taking: Reported on 08/21/2022)   levonorgestrel (MIRENA, 52 MG,) 20 MCG/24HR IUD 1 Intra Uterine Device (1 each total) by Intrauterine route once for 1 dose.   No facility-administered medications prior to visit.    Review of Systems  Skin:  Positive for rash.       Objective    BP 118/81   Pulse 71   Temp 98.6 F (37 C) (Oral)   Ht 5' 7.99" (1.727 m)   Wt 189 lb 12.8 oz (86.1 kg)   SpO2 98%   BMI 28.87 kg/m    Physical Exam Vitals reviewed.  Constitutional:      General: She is awake.     Appearance: Normal appearance. She is well-developed and well-groomed.  HENT:     Head: Normocephalic and atraumatic.  Eyes:     General: Gaze aligned appropriately.        Right eye: No discharge or hordeolum.        Left eye: No discharge or hordeolum.     Extraocular Movements: Extraocular movements intact.     Conjunctiva/sclera: Conjunctivae normal.  Pupils: Pupils are equal, round, and reactive to light.  Pulmonary:     Effort: Pulmonary effort is normal.  Musculoskeletal:     Cervical back: Normal range of motion.  Skin:    General: Skin is warm.     Findings: Rash present. Rash is macular, papular and vesicular. Rash is not crusting or scaling.     Comments: Maculopapular rash present along right side of mouth with vesicles present throughout- erythematous base  Macular rash lesions present along bilateral lower eyelids  No crusting or drainage observed today, no scaling, centrofacial presentation or telangiectasias noted on exam  Neurological:     Mental Status: She is alert.  Psychiatric:        Attention and Perception: Attention and perception normal.        Mood and Affect: Mood and affect normal.        Speech: Speech normal.        Behavior: Behavior normal. Behavior is cooperative.      No results found  for any visits on 08/21/22.  Assessment & Plan      No follow-ups on file.       Problem List Items Addressed This Visit       Musculoskeletal and Integument   Rash of face - Primary    Acute on chronic, ongoing Reports she has had this rash since early summer and has been using Triamcinolone cream to treat but this is not providing relief Recommend she stop using topical steroid on face as this can cause skin thinning and damage Will trial Valtrex to see if there is improvement  Instructed patient that if she notices eye irritation or vision changes, spread of rash further into eyes she should seek out Ophthalmology services urgently Will also place referral to Dermatology for evaluation and management assistance Follow up as needed for persistent or progressing symptoms       Relevant Medications   valACYclovir (VALTREX) 1000 MG tablet   Other Relevant Orders   Ambulatory referral to Dermatology     No follow-ups on file.   I, Maleke Feria E Lucyle Alumbaugh, PA-C, have reviewed all documentation for this visit. The documentation on 08/21/22 for the exam, diagnosis, procedures, and orders are all accurate and complete.   Talitha Givens, MHS, PA-C [facility]

## 2022-08-21 NOTE — Assessment & Plan Note (Addendum)
Acute on chronic, ongoing Reports she has had this rash since early summer and has been using Triamcinolone cream to treat but this is not providing relief Unsure of rash cause but differential includes but is not limited to: HSV, atopic dermatitis, rosacea  Recommend she stop using topical steroid on face as this can cause skin thinning and damage Will trial Valtrex to see if there is improvement - may need to investigate rosacea management options if not successful with antivirals  Instructed patient that if she notices eye irritation or vision changes, spread of rash further into eyes she should seek out Ophthalmology services urgently Will also place referral to Dermatology for evaluation and management assistance Follow up as needed for persistent or progressing symptoms

## 2022-08-23 ENCOUNTER — Ambulatory Visit (INDEPENDENT_AMBULATORY_CARE_PROVIDER_SITE_OTHER): Payer: Self-pay | Admitting: Dermatology

## 2022-08-23 DIAGNOSIS — L710 Perioral dermatitis: Secondary | ICD-10-CM

## 2022-08-23 MED ORDER — DOXYCYCLINE MONOHYDRATE 100 MG PO CAPS: ORAL_CAPSULE | ORAL | 0 refills | Status: DC

## 2022-08-23 MED ORDER — DOXYCYCLINE HYCLATE 20 MG PO TABS: 20.0000 mg | ORAL_TABLET | Freq: Two times a day (BID) | ORAL | 2 refills | Status: DC

## 2022-08-23 NOTE — Progress Notes (Signed)
   Follow-Up Visit   Subjective  Annette Griffin is a 56 y.o. female who presents for the following: Rash (Patient here today for itchy rash at eyelids and around mouth, started around mouth at beginning of summer. Patient has been using TMC 0.1% cream for months at areas of rash. It helped with rash at first but then rash would flare back up. Pt with hx of eczema.).  Patient saw her PCP on Monday and was given rx for Valtrex but patient has not filled.   The following portions of the chart were reviewed this encounter and updated as appropriate:   Tobacco  Allergies  Meds  Problems  Med Hx  Surg Hx  Fam Hx      Review of Systems:  No other skin or systemic complaints except as noted in HPI or Assessment and Plan.  Objective  Well appearing patient in no apparent distress; mood and affect are within normal limits.  A focused examination was performed including face. Relevant physical exam findings are noted in the Assessment and Plan.  face Many inflammatory papules perioral distribution    Assessment & Plan  Perioral dermatitis face  Reviewed diagnosis and expected prognosis  D/c TMC or any other steroids to face  Start doxycycline monohydrate 100 mg twice daily with food x 2 weeks then decrease to doxycycline 20 mg twice daily x 3 months.   Doxycycline should be taken with food to prevent nausea. Do not lay down for 30 minutes after taking. Be cautious with sun exposure and use good sun protection while on this medication. Pregnant women should not take this medication.   Sample of [place] given to patient to use daily.    doxycycline (MONODOX) 100 MG capsule - face Take twice daily with food x 2 weeks then decrease to 20 mg twice daily with food.  doxycycline (PERIOSTAT) 20 MG tablet - face Take 1 tablet (20 mg total) by mouth 2 (two) times daily. Take with food   Return in about 3 months (around 11/23/2022) for perioral dermatitis.  Graciella Belton, RMA, am  acting as scribe for Forest Gleason, MD .  Documentation: I have reviewed the above documentation for accuracy and completeness, and I agree with the above.  Forest Gleason, MD

## 2022-08-23 NOTE — Patient Instructions (Addendum)
D/c TMC or any other steroids to face  Start doxycycline monohydrate 100 mg twice daily with food x 2 weeks then decrease to doxycycline 20 mg twice daily x 3 months.   Doxycycline should be taken with food to prevent nausea. Do not lay down for 30 minutes after taking. Be cautious with sun exposure and use good sun protection while on this medication. Pregnant women should not take this medication.   Due to recent changes in healthcare laws, you may see results of your pathology and/or laboratory studies on MyChart before the doctors have had a chance to review them. We understand that in some cases there may be results that are confusing or concerning to you. Please understand that not all results are received at the same time and often the doctors may need to interpret multiple results in order to provide you with the best plan of care or course of treatment. Therefore, we ask that you please give [place] 2 business days to thoroughly review all your results before contacting the office for clarification. Should we see a critical lab result, you will be contacted sooner.   If You Need Anything After Your Visit  If you have any questions or concerns for your doctor, please call our main line at [phone] and press option 4 to reach your doctor's medical assistant. If no one answers, please leave a voicemail as directed and we will return your call as soon as possible. Messages left after 4 pm will be answered the following business day.   You may also send [place] a message via [place]. We typically respond to MyChart messages within 1-2 business days.  For prescription refills, please ask your pharmacy to contact our office. Our fax number is [phone].  If you have an urgent issue when the clinic is closed that cannot wait until the next business day, you can page your doctor at the number below.    Please note that while we do our best to be available for urgent issues outside of office hours, we  are not available 24/7.   If you have an urgent issue and are unable to reach [place], you may choose to seek medical care at your doctor's office, retail clinic, urgent care center, or emergency room.  If you have a medical emergency, please immediately call 911 or go to the emergency department.  Pager Numbers  - Dr. Nehemiah Massed: [phone]  - Dr. Laurence Ferrari: [phone]  - Dr. Nicole Kindred: [phone]  In the event of inclement weather, please call our main line at [phone] for an update on the status of any delays or closures.  Dermatology Medication Tips: Please keep the boxes that topical medications come in in order to help keep track of the instructions about where and how to use these. Pharmacies typically print the medication instructions only on the boxes and not directly on the medication tubes.   If your medication is too expensive, please contact our office at [phone] option 4 or send [place] a message through [place].   We are unable to tell what your co-pay for medications will be in advance as this is different depending on your insurance coverage. However, we may be able to find a substitute medication at lower cost or fill out paperwork to get insurance to cover a needed medication.   If a prior authorization is required to get your medication covered by your insurance company, please allow [place] 1-2 business days to complete this process.  Drug prices often vary  depending on where the prescription is filled and some pharmacies may offer cheaper prices.  The website www.goodrx.com contains coupons for medications through different pharmacies. The prices here do not account for what the cost may be with help from insurance (it may be cheaper with your insurance), but the website can give you the price if you did not use any insurance.  - You can print the associated coupon and take it with your prescription to the pharmacy.  - You may also stop by our office during regular business  hours and pick up a GoodRx coupon card.  - If you need your prescription sent electronically to a different pharmacy, notify our office through [facility] or by phone at [phone] option 4.     Si Usted Necesita Algo Despus de Su Visita  Tambin puede enviarnos un mensaje a travs de Pharmacist, community. Por lo general respondemos a los mensajes de MyChart en el transcurso de 1 a 2 das hbiles.  Para renovar recetas, por favor pida a su farmacia que se ponga en contacto con nuestra oficina. Harland Dingwall de fax es [place] [phone].  Si tiene un asunto urgente cuando la clnica est cerrada y que no puede esperar hasta el siguiente da hbil, puede llamar/localizar a su doctor(a) al nmero que aparece a continuacin.   Por favor, tenga en cuenta que aunque hacemos todo lo posible para estar disponibles para asuntos urgentes fuera del horario de [place], no estamos disponibles las 24 horas del da, los 7 das de la [place].   Si tiene un problema urgente y no puede comunicarse con nosotros, puede optar por buscar atencin mdica  en el consultorio de su doctor(a), en una clnica privada, en un centro de atencin urgente o en una sala de emergencias.  Si tiene Engineering geologist, por favor llame inmediatamente al 911 o vaya a la sala de emergencias.  Nmeros de bper  - Dr. Nehemiah Massed: [phone]  - Dra. Moye: [phone]  - Dra. Nicole Kindred: [phone]  En caso de inclemencias del [place], por favor llame a Johnsie Kindred principal al [phone] para una actualizacin sobre el [place] de cualquier retraso o cierre.  Consejos para la medicacin en dermatologa: Por favor, guarde las cajas en las que vienen los medicamentos de uso tpico para ayudarle a seguir las instrucciones sobre dnde y cmo usarlos. Las farmacias generalmente imprimen las instrucciones del medicamento slo en las cajas y no directamente en los tubos del [place].   Si su medicamento es muy caro, por favor,  pngase en contacto con Zigmund Daniel llamando al [phone] y presione la opcin 4 o envenos un mensaje a travs de Pharmacist, community.   No podemos decirle cul ser su copago por los medicamentos por adelantado ya que esto es diferente dependiendo de la cobertura de su seguro. Sin embargo, es posible que podamos encontrar un medicamento sustituto a Electrical engineer un formulario para que el seguro cubra el medicamento que se considera necesario.   Si se requiere una autorizacin previa para que su compaa de seguros [place] su medicamento, por favor permtanos de 1 a 2 das hbiles para completar este proceso.  Los precios de los medicamentos varan con frecuencia dependiendo del Environmental consultant de dnde se surte la receta y alguna farmacias pueden ofrecer precios ms baratos.  El sitio web www.goodrx.com tiene cupones para medicamentos de Airline pilot. Los precios aqu no tienen en cuenta lo que podra costar con la ayuda del seguro (puede ser ms barato con su seguro),  pero el sitio web puede darle el precio si no Field seismologist.  - Puede imprimir el cupn correspondiente y llevarlo con su receta a la farmacia.  - Tambin puede pasar por nuestra oficina durante el horario de atencin regular y Charity fundraiser una tarjeta de cupones de GoodRx.  - Si necesita que su receta se enve electrnicamente a una farmacia diferente, informe a nuestra oficina a travs de MyChart de [place] o por telfono llamando al [phone] y presione la opcin 4.

## 2022-08-26 ENCOUNTER — Encounter: Payer: Self-pay | Admitting: Dermatology

## 2022-11-18 ENCOUNTER — Other Ambulatory Visit: Payer: Self-pay | Admitting: Dermatology

## 2022-11-18 DIAGNOSIS — L710 Perioral dermatitis: Secondary | ICD-10-CM

## 2022-11-23 ENCOUNTER — Ambulatory Visit (INDEPENDENT_AMBULATORY_CARE_PROVIDER_SITE_OTHER): Payer: Self-pay | Admitting: Dermatology

## 2022-11-23 VITALS — BP 142/93 | HR 80

## 2022-11-23 DIAGNOSIS — L710 Perioral dermatitis: Secondary | ICD-10-CM

## 2022-11-23 MED ORDER — DOXYCYCLINE HYCLATE 20 MG PO TABS: 20.0000 mg | ORAL_TABLET | Freq: Two times a day (BID) | ORAL | 0 refills | Status: DC

## 2022-11-23 NOTE — Patient Instructions (Addendum)
Some Recommended Sunscreens Include:  Good for Daily Wear (feels like lotion but NOT sweat resistant) Cerave AM Moisturizer with SPF EltaMD UV Lotion  Body or All Over Sunscreen EltaMD UV active for body and face Blue lizard sensitive Sun bum mineral (avoid if sensitive to scent) Aveeno Positively Mineral Neutrogena sheer zinc (Slightly harder to rub in) [facility] clear zinc (Slightly harder to rub in)  Clear Face Sunscreen EltaMD UV Elements CeraVe hydrating sunscreen 50 face  Tinted Face Sunscreen Alastin Hydratint (good for most skin tones, may be slightly dark if you are very fair) Colorescience Sunforgettable Total Protection Face Shield (good for most skin tones) EltaMD UV Physical La Roche Posay Mineral Tinted Cotz Flawless Complexion   Powder Sunscreen (Nice for reapplying or applying on the go) Colorescience Sunforgettable Total Protection Brush on Shield (available in different tints)  Face Sunscreen Available in Different Tints Colorescience Sunforgettable Total Protection Brush on Shield  bareMinerals Complexion Rescue Tinted Hydrating Gel Cream Broad Spectrum SPF 30 UnSun mineral tinted (comes in medium/dark and light/medium)  Kids (over 6 months) - Mineral Sunscreens Recommended eltaMD UV Pure MDsolarSciences KidStick 40 SPF Aveeno Baby Continuous Protection Sensitive Zinc Oxide Blue Lizard Kids mineral based sunscreen lotion Mustela Mineral Sunscreen for face and body Neutrogena Sheer Zinc Kids Sunscreen Stick  Tinted to look like a tan PCAskin sheer tint body spray  Basic OTC daily skin care regimen to prevent photoaging:   Recommend facial moisturizer with sunscreen SPF 30 every morning (OTC brands include CeraVe AM, Neutrogena, Eucerin, Cetaphil, Aveeno, La Roche Posay).  Can also apply a topical Vit C serum which is an antioxidant (OTC brands include CeraVe, La Roche Posay, and The Ordinary) underneath sunscreen in morning. If you are outside during the day  in the summer for extended periods, especially swimming and/or sweating, make sure you apply a water resistant facial sunscreen lotion spf 30 or higher.   At night recommend a cream with retinol (a vitamin A derivative which stimulates collagen production) like CeraVe skin renewing retinol serum or ROC retinol correxion cream or Neutrogena rapid wrinkle repair cream. Retinol may cause skin irritation in people with sensitive skin.  Can use it every other day and/or apply on top of a hyaluronic acid (HA) moisturizer/serum (Neutrogena Hydroboost water cream) if better tolerated that way.  Retinol may also help with lightening brown spots.   Our office sells high quality, medically tested skin care lines such as Elta MD sunscreens (with Zinc), and Alastin skin care products, which are very effective in treating photoaging. The Alastin line includes cosmeceutical grade Vit.C serum, HA serum, Elastin stimulating moisturizers/serums, lightening serum, and sunscreens.  If you want prescription treatment, then you would need an appointment (Rx tretinoin and fade creams, Botox, filler injections, laser treatments, etc.) These prescriptions and procedures are not covered by insurance but work very well.    Due to recent changes in healthcare laws, you may see results of your pathology and/or laboratory studies on MyChart before the doctors have had a chance to review them. We understand that in some cases there may be results that are confusing or concerning to you. Please understand that not all results are received at the same time and often the doctors may need to interpret multiple results in order to provide you with the best plan of care or course of treatment. Therefore, we ask that you please give [place] 2 business days to thoroughly review all your results before contacting the office for clarification. Should we see  a critical lab result, you will be contacted sooner.   If You Need Anything After Your  Visit  If you have any questions or concerns for your doctor, please call our main line at [phone] and press option 4 to reach your doctor's medical assistant. If no one answers, please leave a voicemail as directed and we will return your call as soon as possible. Messages left after 4 pm will be answered the following business day.   You may also send [place] a message via [place]. We typically respond to MyChart messages within 1-2 business days.  For prescription refills, please ask your pharmacy to contact our office. Our fax number is [phone].  If you have an urgent issue when the clinic is closed that cannot wait until the next business day, you can page your doctor at the number below.    Please note that while we do our best to be available for urgent issues outside of office hours, we are not available 24/7.   If you have an urgent issue and are unable to reach [place], you may choose to seek medical care at your doctor's office, retail clinic, urgent care center, or emergency room.  If you have a medical emergency, please immediately call 911 or go to the emergency department.  Pager Numbers  - Dr. Nehemiah Massed: [phone]  - Dr. Laurence Ferrari: [phone]  - Dr. Nicole Kindred: [phone]  In the event of inclement weather, please call our main line at [phone] for an update on the status of any delays or closures.  Dermatology Medication Tips: Please keep the boxes that topical medications come in in order to help keep track of the instructions about where and how to use these. Pharmacies typically print the medication instructions only on the boxes and not directly on the medication tubes.   If your medication is too expensive, please contact our office at [phone] option 4 or send [place] a message through [place].   We are unable to tell what your co-pay for medications will be in advance as this is different depending on your insurance coverage. However, we may be able to find a  substitute medication at lower cost or fill out paperwork to get insurance to cover a needed medication.   If a prior authorization is required to get your medication covered by your insurance company, please allow [place] 1-2 business days to complete this process.  Drug prices often vary depending on where the prescription is filled and some pharmacies may offer cheaper prices.  The website www.goodrx.com contains coupons for medications through different pharmacies. The prices here do not account for what the cost may be with help from insurance (it may be cheaper with your insurance), but the website can give you the price if you did not use any insurance.  - You can print the associated coupon and take it with your prescription to the pharmacy.  - You may also stop by our office during regular business hours and pick up a GoodRx coupon card.  - If you need your prescription sent electronically to a different pharmacy, notify our office through [facility] or by phone at [phone] option 4.     Si Usted Necesita Algo Despus de Su Visita  Tambin puede enviarnos un mensaje a travs de Pharmacist, community. Por lo general respondemos a los mensajes de MyChart en el transcurso de 1 a 2 das hbiles.  Para renovar recetas, por favor pida a su farmacia que se ponga en contacto con  nuestra oficina. Harland Dingwall de fax es [place] [phone].  Si tiene un asunto urgente cuando la clnica est cerrada y que no puede esperar hasta el siguiente da hbil, puede llamar/localizar a su doctor(a) al nmero que aparece a continuacin.   Por favor, tenga en cuenta que aunque hacemos todo lo posible para estar disponibles para asuntos urgentes fuera del horario de [place], no estamos disponibles las 24 horas del da, los 7 das de la [place].   Si tiene un problema urgente y no puede comunicarse con nosotros, puede optar por buscar atencin mdica  en el consultorio de su doctor(a), en una clnica privada, en un  centro de atencin urgente o en una sala de emergencias.  Si tiene Engineering geologist, por favor llame inmediatamente al 911 o vaya a la sala de emergencias.  Nmeros de bper  - Dr. Nehemiah Massed: [phone]  - Dra. Moye: [phone]  - Dra. Nicole Kindred: [phone]  En caso de inclemencias del [place], por favor llame a Johnsie Kindred principal al [phone] para una actualizacin sobre el [place] de cualquier retraso o cierre.  Consejos para la medicacin en dermatologa: Por favor, guarde las cajas en las que vienen los medicamentos de uso tpico para ayudarle a seguir las instrucciones sobre dnde y cmo usarlos. Las farmacias generalmente imprimen las instrucciones del medicamento slo en las cajas y no directamente en los tubos del [place].   Si su medicamento es muy caro, por favor, pngase en contacto con Zigmund Daniel llamando al [phone] y presione la opcin 4 o envenos un mensaje a travs de Pharmacist, community.   No podemos decirle cul ser su copago por los medicamentos por adelantado ya que esto es diferente dependiendo de la cobertura de su seguro. Sin embargo, es posible que podamos encontrar un medicamento sustituto a Electrical engineer un formulario para que el seguro cubra el medicamento que se considera necesario.   Si se requiere una autorizacin previa para que su compaa de seguros [place] su medicamento, por favor permtanos de 1 a 2 das hbiles para completar este proceso.  Los precios de los medicamentos varan con frecuencia dependiendo del Environmental consultant de dnde se surte la receta y alguna farmacias pueden ofrecer precios ms baratos.  El sitio web www.goodrx.com tiene cupones para medicamentos de Airline pilot. Los precios aqu no tienen en cuenta lo que podra costar con la ayuda del seguro (puede ser ms barato con su seguro), pero el sitio web puede darle el precio si no utiliz Research scientist (physical sciences).  - Puede imprimir el cupn correspondiente y llevarlo con su receta  a la farmacia.  - Tambin puede pasar por nuestra oficina durante el horario de atencin regular y Charity fundraiser una tarjeta de cupones de GoodRx.  - Si necesita que su receta se enve electrnicamente a una farmacia diferente, informe a nuestra oficina a travs de MyChart de [place] o por telfono llamando al [phone] y presione la opcin 4.

## 2022-11-23 NOTE — Progress Notes (Signed)
   Follow-Up Visit   Subjective  Annette Griffin is a 57 y.o. female who presents for the following: Perioral dermatitis (Patient clear and has had no flares in the past month. Currently she is using Doxycycline 20 mg po BID. Patient brought in the skin care products that she uses on her face for Dr. Laurence Ferrari to review).   The following portions of the chart were reviewed this encounter and updated as appropriate:   Tobacco  Allergies  Meds  Problems  Med Hx  Surg Hx  Fam Hx      Review of Systems:  No other skin or systemic complaints except as noted in HPI or Assessment and Plan.  Objective  Well appearing patient in no apparent distress; mood and affect are within normal limits.  A focused examination was performed including the face. Relevant physical exam findings are noted in the Assessment and Plan.  Face clear    Assessment & Plan  Perioral dermatitis Face  Clear currently. Plan to taper off of doxycycline.   Perioral dermatitis is an eruption which is usually located around the mouth and nose.  It can be a rash and/or red bumps.  It occasionally occurs around the eyes.  It may be itchy and may burn.  The exact cause is unknown.  Some types of makeup, moisturizers, dental products, and prescription creams may be partially responsible for the eruption.  Topical steroids such as cortisone creams can temporarily make the rash better but with discontinuation the rash tends to recur and worsen, so they should be avoided. Topical antibiotics, elidel cream, protopic ointment, and oral antibiotics may be prescribed to treat this condition.  Although perioral dermatitis is not an infection, some antibiotics have anti-inflammatory properties that help it greatly.  Decrease Doxycycline 20 mg po to QD. If still clear after two weeks may D/C Doxycycline.   Discussed open patch tests for new products - 3 days to the upper inner arm, if tolerated try a test spot on the cheek x 3  days  Reviewed patient's OTC face products. Ok to continue current care products.    Related Medications doxycycline (PERIOSTAT) 20 MG tablet Take 1 tablet (20 mg total) by mouth 2 (two) times daily. Take with food   Return if symptoms worsen or fail to improve.  Luther Redo, CMA, am acting as scribe for Forest Gleason, MD .  Documentation: I have reviewed the above documentation for accuracy and completeness, and I agree with the above.  Forest Gleason, MD

## 2022-12-05 ENCOUNTER — Encounter: Payer: Self-pay | Admitting: Dermatology

## 2022-12-14 ENCOUNTER — Other Ambulatory Visit: Payer: Self-pay | Admitting: Dermatology

## 2022-12-14 DIAGNOSIS — L710 Perioral dermatitis: Secondary | ICD-10-CM

## 2022-12-18 ENCOUNTER — Other Ambulatory Visit: Payer: Self-pay | Admitting: Dermatology

## 2022-12-18 DIAGNOSIS — L710 Perioral dermatitis: Secondary | ICD-10-CM

## 2023-01-19 ENCOUNTER — Other Ambulatory Visit: Payer: Self-pay | Admitting: Dermatology

## 2023-01-19 DIAGNOSIS — L710 Perioral dermatitis: Secondary | ICD-10-CM

## 2023-02-05 ENCOUNTER — Other Ambulatory Visit: Payer: Self-pay | Admitting: Dermatology

## 2023-02-05 DIAGNOSIS — L710 Perioral dermatitis: Secondary | ICD-10-CM

## 2023-06-11 ENCOUNTER — Ambulatory Visit (INDEPENDENT_AMBULATORY_CARE_PROVIDER_SITE_OTHER): Payer: Self-pay | Admitting: Physician Assistant

## 2023-06-11 ENCOUNTER — Other Ambulatory Visit (HOSPITAL_COMMUNITY)
Admission: RE | Admit: 2023-06-11 | Discharge: 2023-06-11 | Disposition: A | Discharge status: Home / Self Care | Payer: Self-pay | Source: Ambulatory Visit | Attending: Physician Assistant | Admitting: Physician Assistant

## 2023-06-11 ENCOUNTER — Encounter: Payer: Self-pay | Admitting: Physician Assistant

## 2023-06-11 VITALS — BP 108/73 | HR 65 | Temp 98.2°F | Ht 68.31 in | Wt 150.8 lb

## 2023-06-11 DIAGNOSIS — Z23 Encounter for immunization: Secondary | ICD-10-CM

## 2023-06-11 DIAGNOSIS — Z7189 Other specified counseling: Secondary | ICD-10-CM | POA: Insufficient documentation

## 2023-06-11 DIAGNOSIS — Z136 Encounter for screening for cardiovascular disorders: Secondary | ICD-10-CM

## 2023-06-11 DIAGNOSIS — Z1159 Encounter for screening for other viral diseases: Secondary | ICD-10-CM

## 2023-06-11 DIAGNOSIS — Z1231 Encounter for screening mammogram for malignant neoplasm of breast: Secondary | ICD-10-CM

## 2023-06-11 DIAGNOSIS — Z0000 Encounter for general adult medical examination without abnormal findings: Secondary | ICD-10-CM

## 2023-06-11 DIAGNOSIS — Z114 Encounter for screening for human immunodeficiency virus [HIV]: Secondary | ICD-10-CM

## 2023-06-11 NOTE — Progress Notes (Signed)
Annual Physical Exam   Name: Annette Griffin   MRN: [id number]    DOB: April 29, 1966   Date:06/11/2023  Today's Provider: Jacquelin Hawking, MHS, PA-C Introduced myself to the patient as a PA-C and provided education on APPs in clinical practice.         Subjective  Chief Complaint  Chief Complaint  Patient presents with   Annual Exam    Pt is fasting    HPI  Patient presents for annual CPE.  Diet: she reports she is taking Semaglutide  Exercise: She is exercising regularly, she is walking everyday.   Sleep:She states her sleep is not very good all the time but when she does fall asleep it is usually pretty good.  Mood: "I think I'm very subdued"   Flowsheet Row Video Visit from 06/01/2021 in [facility]  AUDIT-C Score 2      Depression: Phq 9 is  negative    06/11/2023    4:23 PM 08/21/2022    3:29 PM 02/16/2022    3:38 PM 01/12/2022    2:52 PM 06/01/2021    8:35 AM  Depression screen PHQ 2/9  Decreased Interest 1 1 2 3 3   Down, Depressed, Hopeless 0 1 0 1 2  PHQ - 2 Score 1 2 2 4 5   Altered sleeping 3 2 2 3 3   Tired, decreased energy 1 2 3 3 3   Change in appetite 0 0 2 0 3  Feeling bad or failure about yourself  0 1 1 0 0  Trouble concentrating 0 0 1 2 3   Moving slowly or fidgety/restless 0 0 0 1 0  Suicidal thoughts 0 0 0 0 0  PHQ-9 Score 5 7 11 13 17   Difficult doing work/chores  Somewhat difficult Not difficult at all Somewhat difficult Not difficult at all   Hypertension: BP Readings from Last 3 Encounters:  06/11/23 108/73  11/23/22 (!) 142/93  08/21/22 118/81   Obesity: Wt Readings from Last 3 Encounters:  06/11/23 150 lb 12.8 oz (68.4 kg)  08/21/22 189 lb 12.8 oz (86.1 kg)  02/16/22 177 lb 6.4 oz (80.5 kg)   BMI Readings from Last 3 Encounters:  06/11/23 22.72 kg/m  08/21/22 28.87 kg/m  02/16/22 26.97 kg/m     Vaccines:   HPV: aged out  Tdap: UTD Shingrix: second dose ordered and completed today  Pneumonia:  NA Flu: ordered today  COVID-19: Discussed vaccine and booster recommendations per available CDC guidelines     Hep C Screening: ordered today  HIV screening: ordered today  STD testing and prevention (HIV/chl/gon/syphilis): declines screening today  Intimate partner violence: negative Sexual History: She is sexually with her husband  Menstrual History/LMP/Abnormal Bleeding: She is not having regular periods, has  IUD  Discussed importance of follow up if any post-menopausal bleeding: no Incontinence Symptoms: Yes.  A little bit   Breast cancer hx:  - Last Mammogram: Overdue, ordered today  - BRCA gene screening:   Osteoporosis Prevention : Discussed high calcium and vitamin D supplementation, weight bearing exercises Bone density :not applicable  Cervical cancer screening: due today, performed in office   Skin cancer: Discussed monitoring for atypical lesions  Colorectal cancer screening: UTD- next due in 2026   Lung cancer:  Low Dose CT Chest recommended if Age 55-80 years, 20 pack-year currently smoking OR have quit w/in 15years. Patient does not qualify.  Smoked about a pack per week on and off for  about 6 years >20 years ago  ECG: NA  Advanced Care Planning: A voluntary discussion about advance care planning including the explanation and discussion of advance directives.  Discussed health care proxy and Living will, and the patient was able to identify a health care proxy as no one.  Patient does not have a living will in effect.  Lipids: Lab Results  Component Value Date   CHOL 194 03/12/2020   Lab Results  Component Value Date   HDL 69 03/12/2020   Lab Results  Component Value Date   LDLCALC 102 (H) 03/12/2020   Lab Results  Component Value Date   TRIG 130 03/12/2020   No results found for: "CHOLHDL" No results found for: "LDLDIRECT"  Glucose: Glucose  Date Value Ref Range Status  01/12/2022 88 70 - 99 mg/dL Final  16/07/9603 87 65 - 99 mg/dL Final    Glucose, Bld  Date Value Ref Range Status  02/17/2021 103 (H) 70 - 99 mg/dL Final    Comment:    Glucose reference range applies only to samples taken after fasting for at least 8 hours.  02/16/2021 81 70 - 99 mg/dL Final    Comment:    Glucose reference range applies only to samples taken after fasting for at least 8 hours.    Patient Active Problem List   Diagnosis Date Noted   Advanced care planning/counseling discussion 06/11/2023   Rash of face 08/21/2022   Acute appendicitis 02/16/2021   Depression, major, single episode, moderate (HCC) 01/03/2021   Special screening for malignant neoplasms, colon    Iron deficiency anemia 03/12/2020   Anemia 02/03/2013   Menorrhagia with regular cycle 02/03/2013    Past Surgical History:  Procedure Laterality Date   APPENDECTOMY  02/17/2021   COLONOSCOPY WITH PROPOFOL N/A 04/12/2020   Procedure: COLONOSCOPY WITH PROPOFOL;  Surgeon: Midge Minium, MD;  Location: [facility];  Service: Endoscopy;  Laterality: N/A;  priority 4   LAPAROSCOPIC APPENDECTOMY N/A 02/17/2021   Procedure: APPENDECTOMY LAPAROSCOPIC;  Surgeon: Duanne Guess, MD;  Location: [facility];  Service: General;  Laterality: N/A;   MOUTH SURGERY      Family History  Problem Relation Age of Onset   Colon cancer Father    Breast cancer Maternal Grandmother     Social History   Socioeconomic History   Marital status: Married    Spouse name: Not on file   Number of children: Not on file   Years of education: Not on file   Highest education level: 12th grade  Occupational History   Not on file  Tobacco Use   Smoking status: Former    Current packs/day: 0.00    Types: Cigarettes    Quit date: 2000    Years since quitting: 24.6   Smokeless tobacco: Never  Vaping Use   Vaping status: Never Used  Substance and Sexual Activity   Alcohol use: Not Currently   Drug use: Never   Sexual activity: Yes    Birth control/protection: I.U.D.    Comment: Mirena   Other Topics Concern   Not on file  Social History Narrative   Not on file   Social Determinants of Health   Financial Resource Strain: Low Risk  (06/01/2021)   Overall Financial Resource Strain (CARDIA)    Difficulty of Paying Living Expenses: Not very hard  Food Insecurity: No Food Insecurity (06/01/2021)   Hunger Vital Sign    Worried About Running Out of Food in the Last Year: Never true  Ran Out of Food in the Last Year: Never true  Transportation Needs: No Transportation Needs (06/01/2021)   PRAPARE - Administrator, Civil Service (Medical): No    Lack of Transportation (Non-Medical): No  Physical Activity: Sufficiently Active (06/01/2021)   Exercise Vital Sign    Days of Exercise per Week: 6 days    Minutes of Exercise per Session: 50 min  Stress: No Stress Concern Present (06/01/2021)   [employer] of Occupational Health - Occupational Stress Questionnaire    Feeling of Stress : Only a little  Social Connections: Moderately Isolated (06/01/2021)   Social Connection and Isolation Panel [NHANES]    Frequency of Communication with Friends and Family: More than three times a week    Frequency of Social Gatherings with Friends and Family: More than three times a week    Attends Religious Services: Never    Database administrator or Organizations: No    Attends Engineer, structural: Not on file    Marital Status: Married  Catering manager Violence: Not on file     Current Outpatient Medications:    Cholecalciferol (VITAMIN D3) 125 MCG (5000 UT) CAPS, Take 1 capsule by mouth daily., Disp: , Rfl:    ferrous sulfate 325 (65 FE) MG EC tablet, Take 325 mg by mouth daily., Disp: , Rfl:    Multiple Vitamin (MULTIVITAMIN) tablet, Take 1 tablet by mouth daily., Disp: , Rfl:    Omega 3-6-9 Fatty Acids (TRIPLE OMEGA-3-6-9) CAPS, Take 1 capsule by mouth daily., Disp: , Rfl:    SEMAGLUTIDE-WEIGHT MANAGEMENT [place], Inject into the skin once a week. She states she is  taking compounded formula and states she is  taking 40 mL- unsure of dosing/strength, Disp: , Rfl:    triamcinolone cream (KENALOG) 0.1 %, Apply 1 application. topically 2 (two) times daily., Disp: 30 g, Rfl: 0   UNABLE TO FIND, Take 3 tablets by mouth daily. Neuro-Mag, Disp: , Rfl:    vitamin C (ASCORBIC ACID) 500 MG tablet, Take 500 mg by mouth daily., Disp: , Rfl:    levonorgestrel (MIRENA, 52 MG,) 20 MCG/24HR IUD, 1 Intra Uterine Device (1 each total) by Intrauterine route once for 1 dose., Disp: 1 Intra Uterine Device, Rfl: 0   UNABLE TO FIND, Take 2 tablets by mouth daily. Amberen (Patient not taking: Reported on 11/23/2022), Disp: , Rfl:   No Known Allergies   Review of Systems  Constitutional:  Negative for chills, fever and weight loss.  HENT:  Negative for hearing loss, nosebleeds, sore throat and tinnitus.   Eyes:  Negative for blurred vision, double vision and photophobia.  Respiratory:  Negative for shortness of breath and wheezing.   Cardiovascular:  Negative for chest pain, palpitations and leg swelling.  Gastrointestinal:  Negative for blood in stool, constipation, diarrhea, heartburn, nausea and vomiting.  Genitourinary:  Negative for dysuria and hematuria.  Musculoskeletal:  Negative for falls, joint pain and myalgias.  Skin:  Negative for itching and rash.  Neurological:  Negative for dizziness, tingling, tremors, loss of consciousness, weakness and headaches.  Psychiatric/Behavioral:  Negative for depression, memory loss, substance abuse and suicidal ideas. The patient is not nervous/anxious.       Objective  Vitals:   06/11/23 1602  BP: 108/73  Pulse: 65  Temp: 98.2 F (36.8 C)  SpO2: 98%  Weight: 150 lb 12.8 oz (68.4 kg)  Height: 5' 8.31" (1.735 m)    Body mass index is 22.72 kg/m.  Physical  Exam Vitals reviewed. Exam conducted with a chaperone present.  Constitutional:      General: She is awake.     Appearance: Normal appearance. She is  well-developed and well-groomed.  HENT:     Head: Normocephalic and atraumatic.     Right Ear: Hearing, tympanic membrane and ear canal normal.     Left Ear: Hearing, tympanic membrane and ear canal normal.     Mouth/Throat:     Lips: Pink.     Mouth: Mucous membranes are moist.     Pharynx: Uvula midline. No pharyngeal swelling, oropharyngeal exudate, posterior oropharyngeal erythema or uvula swelling.     Tonsils: No tonsillar exudate or tonsillar abscesses.  Eyes:     General: Lids are normal. Gaze aligned appropriately.     Extraocular Movements: Extraocular movements intact.     Conjunctiva/sclera: Conjunctivae normal.     Pupils: Pupils are equal, round, and reactive to light.  Neck:     Thyroid: No thyroid mass, thyromegaly or thyroid tenderness.     Trachea: Trachea normal.  Cardiovascular:     Rate and Rhythm: Normal rate and regular rhythm.     Pulses: Normal pulses.          Radial pulses are 2+ on the right side and 2+ on the left side.     Heart sounds: Normal heart sounds. No murmur heard.    No friction rub. No gallop.  Pulmonary:     Effort: Pulmonary effort is normal.     Breath sounds: Normal breath sounds. No decreased air movement. No decreased breath sounds, wheezing, rhonchi or rales.  Abdominal:     General: Abdomen is flat. Bowel sounds are normal.     Palpations: Abdomen is soft.     Tenderness: There is no abdominal tenderness.  Genitourinary:    General: Normal vulva.     Pubic Area: No rash or pubic lice.      Tanner stage (genital): 5.     Vagina: Normal.     Cervix: Normal.     Uterus: Normal.      Adnexa: Right adnexa normal and left adnexa normal.     Comments: Chaperone:  Musculoskeletal:     Cervical back: Normal range of motion and neck supple.     Right lower leg: No edema.     Left lower leg: No edema.  Lymphadenopathy:     Head:     Right side of head: No submental, submandibular or preauricular adenopathy.     Left side of head: No  submental, submandibular or preauricular adenopathy.     Cervical:     Right cervical: No superficial or posterior cervical adenopathy.    Left cervical: No superficial or posterior cervical adenopathy.     Upper Body:     Right upper body: No supraclavicular adenopathy.     Left upper body: No supraclavicular adenopathy.  Neurological:     General: No focal deficit present.     Mental Status: She is alert and oriented to person, place, and time.     GCS: GCS eye subscore is 4. GCS verbal subscore is 5. GCS motor subscore is 6.     Cranial Nerves: No cranial nerve deficit, dysarthria or facial asymmetry.     Gait: Gait is intact.  Psychiatric:        Attention and Perception: Attention and perception normal.        Mood and Affect: Mood and affect normal.  Speech: Speech normal.        Behavior: Behavior normal. Behavior is cooperative.        Thought Content: Thought content normal.      No results found for this or any previous visit (from the past 2160 hour(s)).   Fall Risk:    06/11/2023    4:22 PM 08/21/2022    3:29 PM 02/16/2022    3:38 PM 01/12/2022    2:52 PM 12/17/2020    4:02 PM  Fall Risk   Falls in the past year? 0 0 0 0 0  Number falls in past yr: 0 0 0 0 0  Injury with Fall? 0 0 0 0 0  Risk for fall due to : No Fall Risks No Fall Risks No Fall Risks No Fall Risks   Follow up Falls evaluation completed Falls evaluation completed Falls evaluation completed Falls evaluation completed      Functional Status Survey:     Assessment & Plan  Problem List Items Addressed This Visit       Other   Advanced care planning/counseling discussion    A voluntary discussion about advance care planning including the explanation and discussion of advance directives was extensively discussed  with the patient for 5 minutes with patient and myself present.  Explanation about the health care proxy and Living will was reviewed and packet with forms with explanation of how  to fill them out was given.  During this discussion, the patient was not able to identify a health care proxy  and plans to fill out the paperwork required.  Patient was offered a separate Advance Care Planning visit for further assistance with forms.         Other Visit Diagnoses     Need for influenza vaccination    -  Primary   Annual physical exam     -USPSTF grade A and B recommendations reviewed with patient; age-appropriate recommendations, preventive care, screening tests, etc discussed and encouraged; healthy living encouraged; see AVS for patient education given to patient -Discussed importance of 150 minutes of physical activity weekly, eat two servings of fish weekly, eat one serving of tree nuts ( cashews, pistachios, pecans, almonds.Marland Kitchen) every other day, eat 6 servings of fruit/vegetables daily and drink plenty of water and avoid sweet beverages.   -Reviewed Health Maintenance: Yes.     Relevant Orders   MM 3D SCREENING MAMMOGRAM BILATERAL BREAST   Comp Met (CMET)   CBC w/Diff   Lipid Profile   HgB A1c   Hepatitis C antibody   HIV antibody (with reflex)   TSH   Cytology - PAP   Need for shingles vaccine       Relevant Orders   Zoster, Recombinant (Shingrix) (Completed)   Encounter for screening mammogram for malignant neoplasm of breast       Relevant Orders   MM 3D SCREENING MAMMOGRAM BILATERAL BREAST   Encounter for hepatitis C screening test for low risk patient       Relevant Orders   Hepatitis C antibody   Screening for HIV (human immunodeficiency virus)       Relevant Orders   HIV antibody (with reflex)   Screening for ischemic heart disease       Relevant Orders   Lipid Profile        Return in about 1 year (around 06/10/2024) for Annual physical.   I, Leisa Gault E Margarite Vessel, PA-C, have reviewed all documentation for this visit. The documentation on 06/11/23  for the exam, diagnosis, procedures, and orders are all accurate and complete.   Jacquelin Hawking, MHS,  PA-C [facility]

## 2023-06-11 NOTE — Assessment & Plan Note (Signed)

## 2023-06-12 LAB — CBC WITH DIFFERENTIAL/PLATELET
Basophils Absolute: 0.1 10*3/uL (ref 0.0–0.2)
Basos: 1 %
EOS (ABSOLUTE): 0.1 10*3/uL (ref 0.0–0.4)
Eos: 1 %
Hematocrit: 40.7 % (ref 34.0–46.6)
Hemoglobin: 14 g/dL (ref 11.1–15.9)
Immature Grans (Abs): 0 10*3/uL (ref 0.0–0.1)
Immature Granulocytes: 0 %
Lymphocytes Absolute: 1.7 10*3/uL (ref 0.7–3.1)
Lymphs: 37 %
MCH: 32.4 pg (ref 26.6–33.0)
MCHC: 34.4 g/dL (ref 31.5–35.7)
MCV: 94 fL (ref 79–97)
Monocytes Absolute: 0.3 10*3/uL (ref 0.1–0.9)
Monocytes: 7 %
Neutrophils Absolute: 2.4 10*3/uL (ref 1.4–7.0)
Neutrophils: 54 %
Platelets: 291 10*3/uL (ref 150–450)
RBC: 4.32 x10E6/uL (ref 3.77–5.28)
RDW: 12.3 % (ref 11.7–15.4)
WBC: 4.5 10*3/uL (ref 3.4–10.8)

## 2023-06-12 LAB — COMPREHENSIVE METABOLIC PANEL
ALT: 15 IU/L (ref 0–32)
AST: 21 IU/L (ref 0–40)
Albumin: 4.8 g/dL (ref 3.8–4.9)
Alkaline Phosphatase: 59 IU/L (ref 44–121)
BUN/Creatinine Ratio: 12 (ref 9–23)
BUN: 9 mg/dL (ref 6–24)
Bilirubin Total: 0.7 mg/dL (ref 0.0–1.2)
CO2: 24 mmol/L (ref 20–29)
Calcium: 10.4 mg/dL — ABNORMAL HIGH (ref 8.7–10.2)
Chloride: 100 mmol/L (ref 96–106)
Creatinine, Ser: 0.73 mg/dL (ref 0.57–1.00)
Globulin, Total: 2.4 g/dL (ref 1.5–4.5)
Glucose: 87 mg/dL (ref 70–99)
Potassium: 4.2 mmol/L (ref 3.5–5.2)
Sodium: 139 mmol/L (ref 134–144)
Total Protein: 7.2 g/dL (ref 6.0–8.5)
eGFR: 97 mL/min/{1.73_m2} (ref >59)

## 2023-06-12 LAB — LIPID PANEL
Chol/HDL Ratio: 3.1 ratio (ref 0.0–4.4)
Cholesterol, Total: 209 mg/dL — ABNORMAL HIGH (ref 100–199)
HDL: 68 mg/dL (ref >39)
LDL Chol Calc (NIH): 129 mg/dL — ABNORMAL HIGH (ref 0–99)
Triglycerides: 66 mg/dL (ref 0–149)
VLDL Cholesterol Cal: 12 mg/dL (ref 5–40)

## 2023-06-12 LAB — HEPATITIS C ANTIBODY: Hep C Virus Ab: NONREACTIVE

## 2023-06-12 LAB — HIV ANTIBODY (ROUTINE TESTING W REFLEX): HIV Screen 4th Generation wRfx: NONREACTIVE

## 2023-06-12 LAB — TSH: TSH: 1.33 u[IU]/mL (ref 0.450–4.500)

## 2023-06-12 LAB — HEMOGLOBIN A1C
Est. average glucose Bld gHb Est-mCnc: 105 mg/dL
Hgb A1c MFr Bld: 5.3 % (ref 4.8–5.6)

## 2023-06-13 LAB — CYTOLOGY - PAP
Comment: NEGATIVE
Diagnosis: NEGATIVE
High risk HPV: NEGATIVE

## 2023-06-15 NOTE — Progress Notes (Signed)
Your electrolytes, liver and kidney function were in normal limits with the exception of your calcium. It was a bit high so please try to reduce your dietary intake for the next few weeks.  Your Cholesterol has increased since it was last checked. At this time I recommend reducing saturated fats and increasing your weekly exercise to help manage this. Your A1c was 5.3 which is in normal range Your Hep C and HIV testing were negative  Your Thyroid testing was normal Your CBC was normal- no signs of anemia Your Pap was negative for malignancy or lesions - we can repeat this in 3 years.
